# Patient Record
Sex: Female | Born: 1990 | Race: White | Hispanic: No | Marital: Married | Smoking: Never smoker
Health system: Southern US, Community
[De-identification: ages and names within clinical notes are randomized; demographics above are authoritative.]

## PROBLEM LIST (undated history)

## (undated) DIAGNOSIS — I10 Essential (primary) hypertension: Secondary | ICD-10-CM

## (undated) DIAGNOSIS — G43909 Migraine, unspecified, not intractable, without status migrainosus: Secondary | ICD-10-CM

## (undated) DIAGNOSIS — Z8489 Family history of other specified conditions: Secondary | ICD-10-CM

## (undated) DIAGNOSIS — T8859XA Other complications of anesthesia, initial encounter: Secondary | ICD-10-CM

## (undated) DIAGNOSIS — F419 Anxiety disorder, unspecified: Secondary | ICD-10-CM

## (undated) DIAGNOSIS — T4145XA Adverse effect of unspecified anesthetic, initial encounter: Secondary | ICD-10-CM

## (undated) HISTORY — PX: ADENOIDECTOMY: SUR15

## (undated) HISTORY — PX: TONSILLECTOMY: SUR1361

---
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
## 2008-10-06 ENCOUNTER — Inpatient Hospital Stay (HOSPITAL_COMMUNITY): Admission: AD | Admit: 2008-10-06 | Discharge: 2008-10-06 | Payer: Self-pay | Admitting: Obstetrics & Gynecology

## 2008-11-07 ENCOUNTER — Inpatient Hospital Stay (HOSPITAL_COMMUNITY): Admission: AD | Admit: 2008-11-07 | Discharge: 2008-11-08 | Payer: Self-pay | Admitting: Obstetrics & Gynecology

## 2008-11-27 ENCOUNTER — Inpatient Hospital Stay (HOSPITAL_COMMUNITY): Admission: AD | Admit: 2008-11-27 | Discharge: 2008-11-27 | Payer: Self-pay | Admitting: Obstetrics & Gynecology

## 2008-12-21 ENCOUNTER — Inpatient Hospital Stay (HOSPITAL_COMMUNITY): Admission: AD | Admit: 2008-12-21 | Discharge: 2008-12-22 | Payer: Self-pay | Admitting: Obstetrics & Gynecology

## 2008-12-26 ENCOUNTER — Inpatient Hospital Stay (HOSPITAL_COMMUNITY): Admission: AD | Admit: 2008-12-26 | Discharge: 2008-12-26 | Payer: Self-pay | Admitting: Obstetrics & Gynecology

## 2008-12-30 ENCOUNTER — Inpatient Hospital Stay (HOSPITAL_COMMUNITY): Admission: AD | Admit: 2008-12-30 | Discharge: 2008-12-31 | Payer: Self-pay | Admitting: Obstetrics & Gynecology

## 2009-01-02 ENCOUNTER — Inpatient Hospital Stay (HOSPITAL_COMMUNITY): Admission: AD | Admit: 2009-01-02 | Discharge: 2009-01-04 | Payer: Self-pay | Admitting: Obstetrics & Gynecology

## 2009-02-04 ENCOUNTER — Emergency Department (HOSPITAL_COMMUNITY): Admission: EM | Admit: 2009-02-04 | Discharge: 2009-02-04 | Payer: Self-pay | Admitting: Emergency Medicine

## 2010-04-01 IMAGING — CT CT HEAD W/O CM
1 series · 16 of 30 positions shown, 20 images · non-contrast
Comparison: None

CLINICAL DATA: Migraine headache and dizziness; 5 weeks postpartum.

CT HEAD WITHOUT CONTRAST
TECHNIQUE: Contiguous axial images were obtained from the base of
the skull through the vertex without contrast.

[Series 2: headseq 4.8 h37s · axial · 0.46mm/px · z∈[+115,+248]mm · 16 of 30 slices shown, 20 images]
[im 2/30  brain]
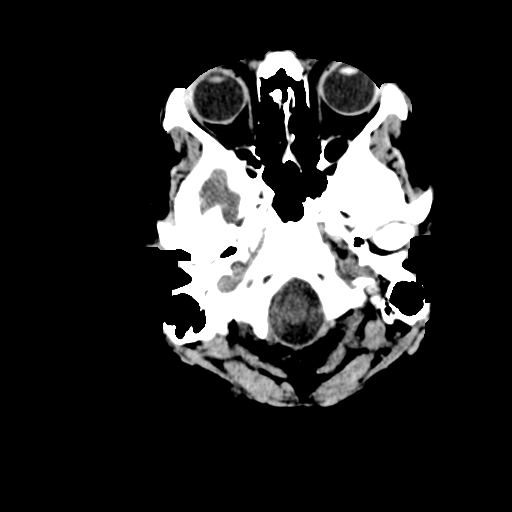
[im 2/30  bone]
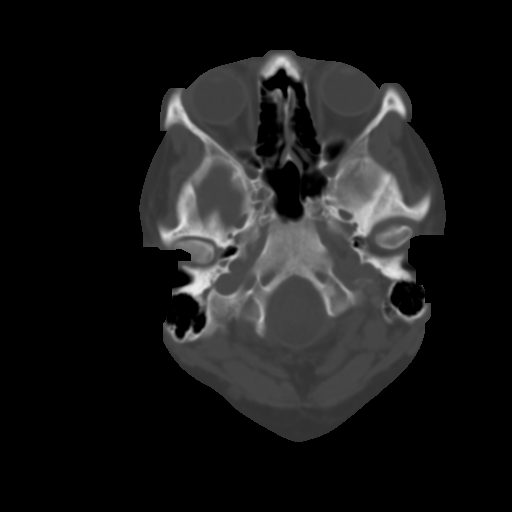
[im 4/30  brain]
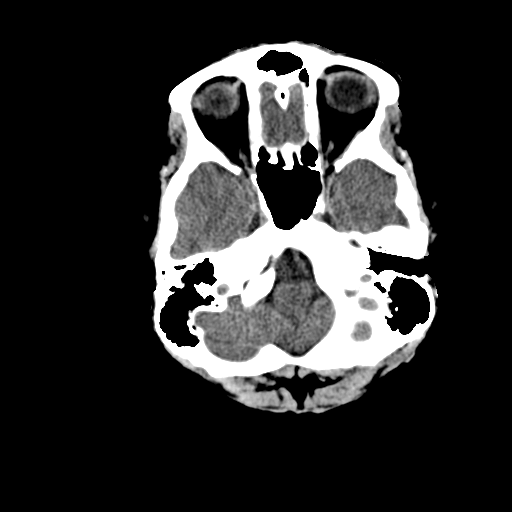
[im 6/30  brain]
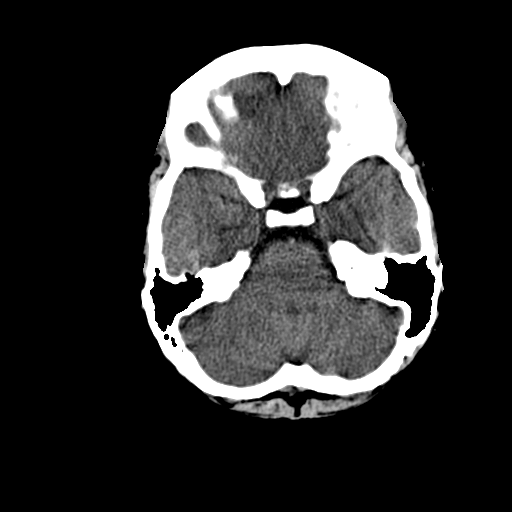
[im 8/30  brain]
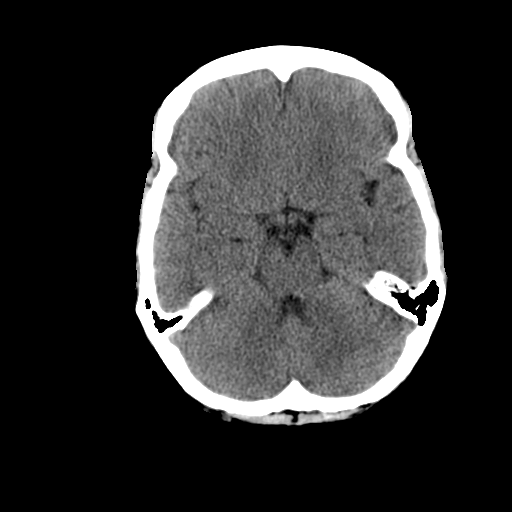
[im 9/30  brain]
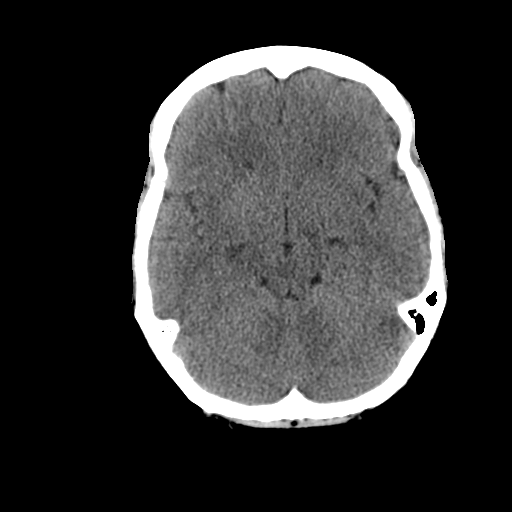
[im 9/30  bone]
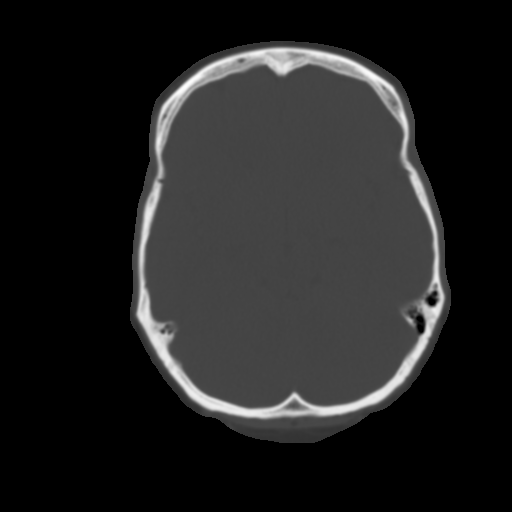
[im 11/30  brain]
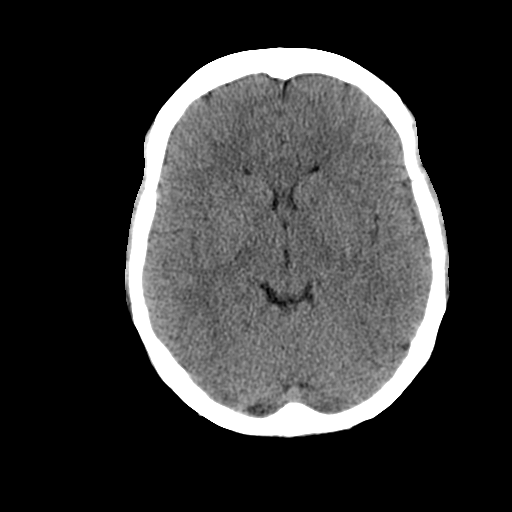
[im 13/30  brain]
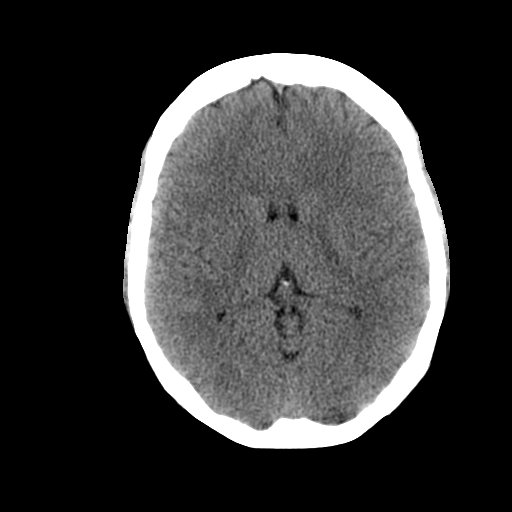
[im 15/30  brain]
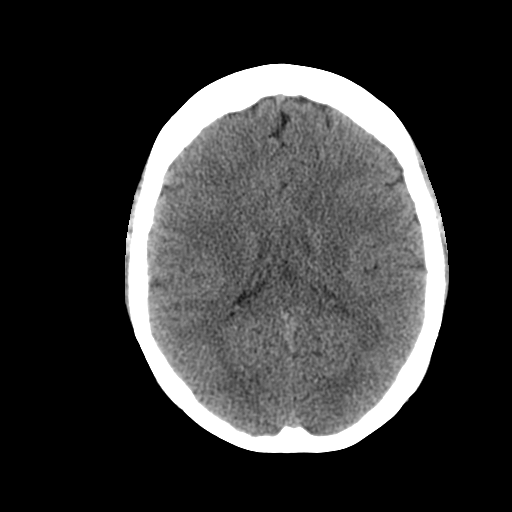
[im 16/30  brain]
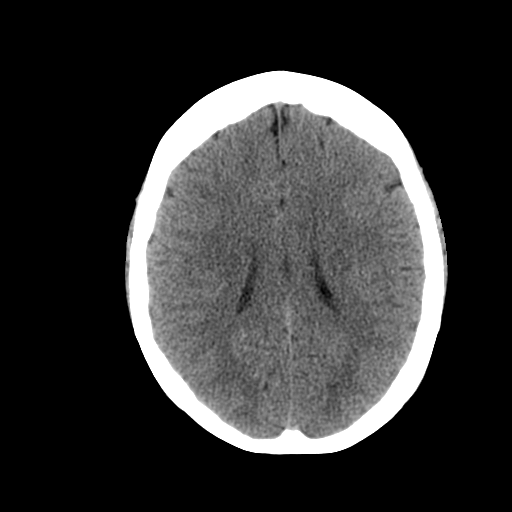
[im 16/30  bone]
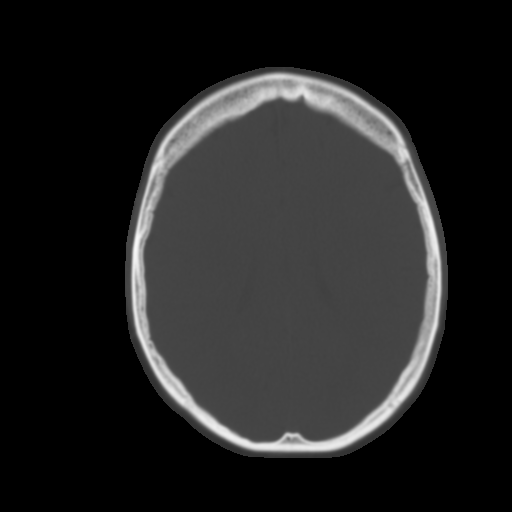
[im 18/30  brain]
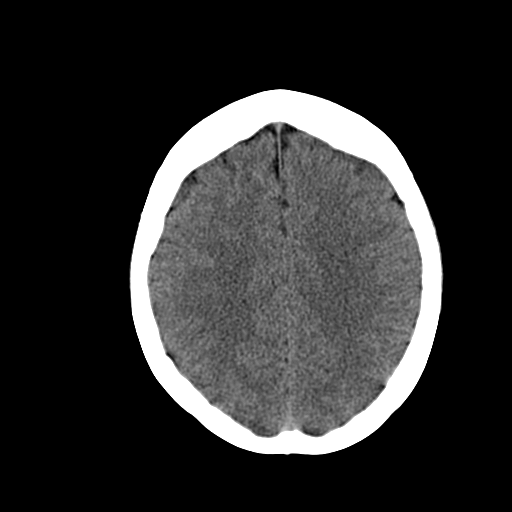
[im 20/30  brain]
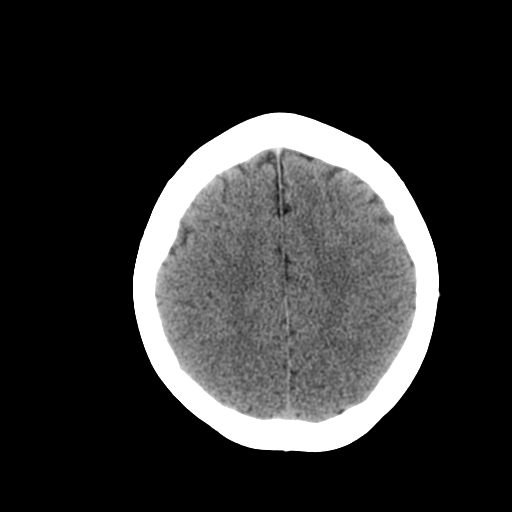
[im 22/30  brain]
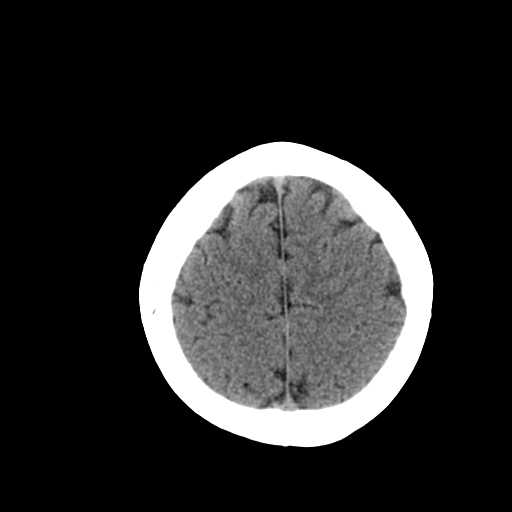
[im 23/30  brain]
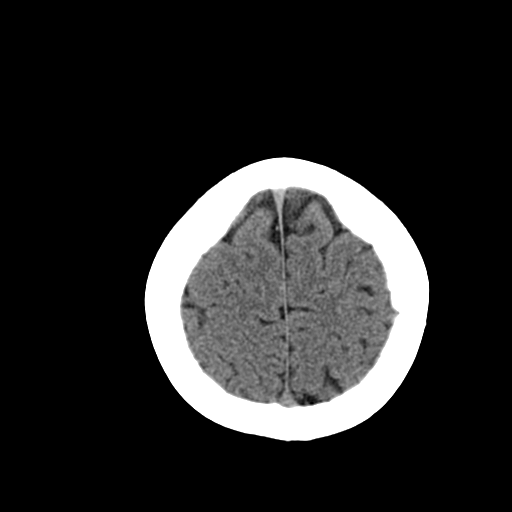
[im 23/30  bone]
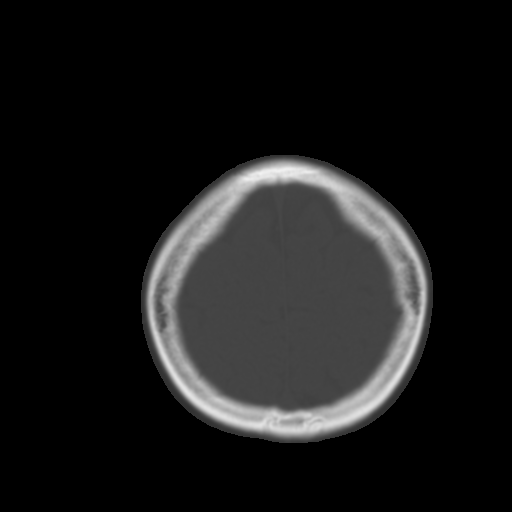
[im 25/30  brain]
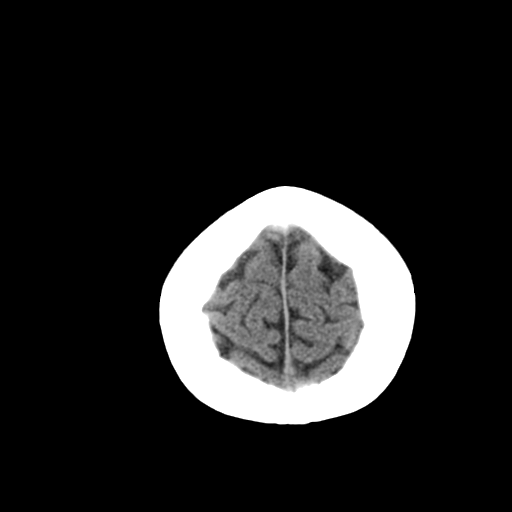
[im 27/30  brain]
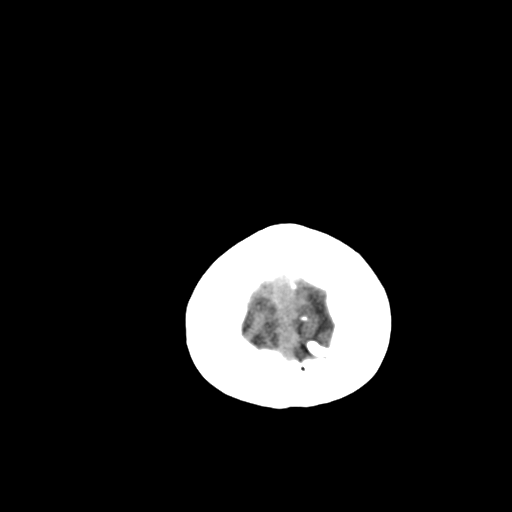
[im 29/30  brain]
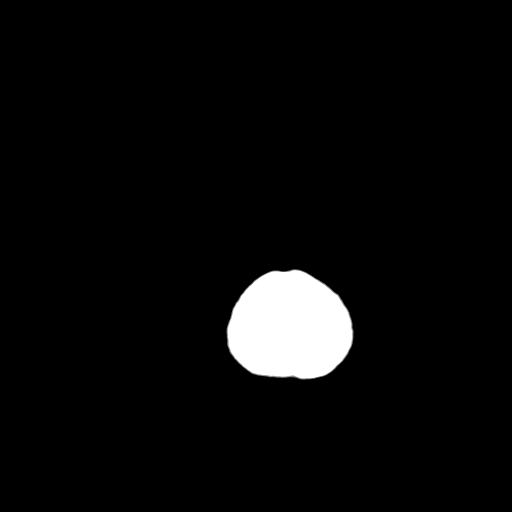

[16 of 30 positions shown; findings below may reference images not displayed]

FINDINGS: There is no evidence of acute infarction, mass lesion, or
intra- or extra-axial hemorrhage on CT.

The posterior fossa, including the cerebellum, brainstem and fourth
ventricle, is within normal limits.  The third and lateral
ventricles, and basal ganglia are unremarkable in appearance.  The
cerebral hemispheres are symmetric in appearance, with normal gray-
white differentiation.  No mass effect or midline shift is seen.

There is no evidence of fracture; visualized osseous structures are
unremarkable in appearance.  The visualized portions of the orbits
are within normal limits.  The paranasal sinuses and mastoid air
cells are well-aerated.  No significant soft tissue abnormalities
are seen.
IMPRESSION: No acute intracranial pathology seen.

## 2010-05-01 ENCOUNTER — Inpatient Hospital Stay (HOSPITAL_COMMUNITY)
Admission: AD | Admit: 2010-05-01 | Discharge: 2010-05-02 | Disposition: A | Discharge status: Home / Self Care | Payer: Medicaid Other | Source: Ambulatory Visit | Attending: Obstetrics & Gynecology | Admitting: Obstetrics & Gynecology

## 2010-05-01 DIAGNOSIS — O9989 Other specified diseases and conditions complicating pregnancy, childbirth and the puerperium: Secondary | ICD-10-CM

## 2010-05-01 DIAGNOSIS — R42 Dizziness and giddiness: Secondary | ICD-10-CM | POA: Insufficient documentation

## 2010-05-01 DIAGNOSIS — O99891 Other specified diseases and conditions complicating pregnancy: Secondary | ICD-10-CM | POA: Insufficient documentation

## 2010-05-01 LAB — URINALYSIS, ROUTINE W REFLEX MICROSCOPIC
Hgb urine dipstick: NEGATIVE
Ketones, ur: NEGATIVE mg/dL
Protein, ur: NEGATIVE mg/dL
Urine Glucose, Fasting: NEGATIVE mg/dL
Urobilinogen, UA: 0.2 mg/dL (ref 0.0–1.0)

## 2010-05-01 LAB — URINE MICROSCOPIC-ADD ON

## 2010-05-11 ENCOUNTER — Inpatient Hospital Stay (HOSPITAL_COMMUNITY)
Admission: AD | Admit: 2010-05-11 | Discharge: 2010-05-14 | DRG: 775 | Disposition: A | Discharge status: Home / Self Care | Payer: Medicaid Other | Source: Ambulatory Visit | Attending: Obstetrics & Gynecology | Admitting: Obstetrics & Gynecology

## 2010-05-11 LAB — CBC
HCT: 34.3 % — ABNORMAL LOW (ref 36.0–46.0)
Hemoglobin: 11 g/dL — ABNORMAL LOW (ref 12.0–15.0)
MCV: 79.6 fL (ref 78.0–100.0)
WBC: 13.2 10*3/uL — ABNORMAL HIGH (ref 4.0–10.5)

## 2010-05-13 LAB — CBC
HCT: 26.2 % — ABNORMAL LOW (ref 36.0–46.0)
Hemoglobin: 8.4 g/dL — ABNORMAL LOW (ref 12.0–15.0)
MCV: 80.9 fL (ref 78.0–100.0)
RBC: 3.24 MIL/uL — ABNORMAL LOW (ref 3.87–5.11)
WBC: 10.7 10*3/uL — ABNORMAL HIGH (ref 4.0–10.5)

## 2010-05-14 LAB — RH IMMUNE GLOB WKUP(>/=20WKS)(NOT WOMEN'S HOSP): Unit division: 0

## 2010-06-08 LAB — DIFFERENTIAL
Basophils Absolute: 0 10*3/uL (ref 0.0–0.1)
Eosinophils Relative: 2 % (ref 0–5)
Lymphocytes Relative: 36 % (ref 12–46)

## 2010-06-08 LAB — CBC
HCT: 39.3 % (ref 36.0–46.0)
Platelets: 257 10*3/uL (ref 150–400)
RDW: 12.2 % (ref 11.5–15.5)

## 2010-06-08 LAB — BASIC METABOLIC PANEL
BUN: 17 mg/dL (ref 6–23)
Calcium: 9.5 mg/dL (ref 8.4–10.5)
Creatinine, Ser: 1.04 mg/dL (ref 0.4–1.2)
GFR calc non Af Amer: >60 mL/min (ref >60)
Glucose, Bld: 79 mg/dL (ref 70–99)
Potassium: 3.7 mEq/L (ref 3.5–5.1)

## 2010-06-10 LAB — URINALYSIS, ROUTINE W REFLEX MICROSCOPIC
Ketones, ur: 15 mg/dL — AB
Nitrite: NEGATIVE
Protein, ur: NEGATIVE mg/dL
pH: 6.5 (ref 5.0–8.0)

## 2010-06-10 LAB — CBC
HCT: 36.5 % (ref 36.0–46.0)
Hemoglobin: 12.5 g/dL (ref 12.0–15.0)
RBC: 3.99 MIL/uL (ref 3.87–5.11)
WBC: 13.1 10*3/uL — ABNORMAL HIGH (ref 4.0–10.5)
WBC: 17.1 10*3/uL — ABNORMAL HIGH (ref 4.0–10.5)

## 2010-06-10 LAB — URINE CULTURE: Colony Count: 25000

## 2010-06-10 LAB — RPR: RPR Ser Ql: NONREACTIVE

## 2010-06-10 LAB — URINE MICROSCOPIC-ADD ON

## 2010-06-11 LAB — URINE MICROSCOPIC-ADD ON

## 2010-06-11 LAB — URINALYSIS, ROUTINE W REFLEX MICROSCOPIC
Glucose, UA: NEGATIVE mg/dL
Glucose, UA: 100 mg/dL — AB
Hgb urine dipstick: NEGATIVE
Specific Gravity, Urine: 1.015 (ref 1.005–1.030)
Specific Gravity, Urine: >1.03 — ABNORMAL HIGH (ref 1.005–1.030)
Urobilinogen, UA: 1 mg/dL (ref 0.0–1.0)
Urobilinogen, UA: 2 mg/dL — ABNORMAL HIGH (ref 0.0–1.0)

## 2010-06-11 LAB — URINE CULTURE

## 2010-06-11 LAB — WET PREP, GENITAL
Clue Cells Wet Prep HPF POC: NONE SEEN
Trich, Wet Prep: NONE SEEN
Yeast Wet Prep HPF POC: NONE SEEN

## 2010-06-11 LAB — GC/CHLAMYDIA PROBE AMP, GENITAL: GC Probe Amp, Genital: NEGATIVE

## 2010-06-12 LAB — WET PREP, GENITAL
Trich, Wet Prep: NONE SEEN
Yeast Wet Prep HPF POC: NONE SEEN

## 2010-06-12 LAB — URINALYSIS, ROUTINE W REFLEX MICROSCOPIC
Bilirubin Urine: NEGATIVE
Nitrite: NEGATIVE
Specific Gravity, Urine: 1.025 (ref 1.005–1.030)
Urobilinogen, UA: 1 mg/dL (ref 0.0–1.0)

## 2010-07-20 NOTE — Consult Note (Signed)
NAME:  [name], [name] NO.:  [id number]   MEDICAL RECORD NO.:  [id number]          PATIENT TYPE:  MAT   LOCATION:  [facility]                          FACILITY:  [facility]   PHYSICIAN:  Allie Bossier, MD        DATE OF BIRTH:  [birth year]   DATE OF CONSULTATION:  DATE OF DISCHARGE:                                 CONSULTATION   Jennifer Briggs is a 20 year old gravida 1, para 0 who is about [redacted] weeks  pregnant.  She is a patient of [facility] in [place].  She  came in to be evaluated after talking with the nursing service following  a fall at her house.  She slipped on one stair, and actually just sort  of twisted her foot.  She did not land on her abdomen or have any blow  to her abdomen at all.  Her foot actually fine; may be a little sore.  It is not swollen or injured in any way.  We have gotten Dopplers on the  baby, and it really is impossible to do a tracing on her.  We will keep  a TOCO on her for about an hour.  She is not having any vaginal  bleeding, any abdominal tenderness.  Basically, was she was just kind of  scared and wanted to make sure the baby was okay.  At this point it  looks like it is.  So if everything stays fine, we will discharge her  home.      Jacklyn Shell, C.N.M.      Allie Bossier, MD  Electronically Signed    FC/MEDQ  D:  10/04/2006  T:  10/04/2006  Job:  [id number]   cc:   [facility]

## 2010-11-01 ENCOUNTER — Other Ambulatory Visit (HOSPITAL_COMMUNITY): Payer: Self-pay | Admitting: Family Medicine

## 2010-11-01 DIAGNOSIS — R1032 Left lower quadrant pain: Secondary | ICD-10-CM

## 2010-11-02 ENCOUNTER — Ambulatory Visit (HOSPITAL_COMMUNITY)
Admission: RE | Admit: 2010-11-02 | Discharge: 2010-11-02 | Disposition: A | Discharge status: Home / Self Care | Payer: Medicaid Other | Source: Ambulatory Visit | Attending: Family Medicine | Admitting: Family Medicine

## 2010-11-02 ENCOUNTER — Other Ambulatory Visit (HOSPITAL_COMMUNITY): Payer: Self-pay | Admitting: Family Medicine

## 2010-11-02 DIAGNOSIS — R1032 Left lower quadrant pain: Secondary | ICD-10-CM | POA: Insufficient documentation

## 2010-12-14 LAB — BASIC METABOLIC PANEL
CO2: 26
Calcium: 9
Glucose, Bld: 83
Sodium: 136

## 2010-12-14 LAB — DIFFERENTIAL
Basophils Absolute: 0
Basophils Relative: 0
Eosinophils Relative: 1
Monocytes Absolute: 0.6
Neutro Abs: 6.8

## 2010-12-14 LAB — CBC
Hemoglobin: 12.6
MCHC: 33.1
RDW: 12.8

## 2010-12-15 LAB — CBC
HCT: 34 — ABNORMAL LOW
Hemoglobin: 11.7 — ABNORMAL LOW
MCHC: 34.4
MCV: 84.5
RBC: 4.03

## 2010-12-15 LAB — WET PREP, GENITAL: Trich, Wet Prep: NONE SEEN

## 2010-12-20 LAB — URINALYSIS, ROUTINE W REFLEX MICROSCOPIC
Bilirubin Urine: NEGATIVE
Glucose, UA: NEGATIVE
Hgb urine dipstick: NEGATIVE
Ketones, ur: NEGATIVE
pH: 6.5

## 2010-12-20 LAB — URINE MICROSCOPIC-ADD ON: RBC / HPF: NONE SEEN

## 2011-01-06 ENCOUNTER — Emergency Department (HOSPITAL_COMMUNITY)
Admission: EM | Admit: 2011-01-06 | Discharge: 2011-01-06 | Disposition: A | Discharge status: Home / Self Care | Payer: Self-pay | Attending: Emergency Medicine | Admitting: Emergency Medicine

## 2011-01-06 ENCOUNTER — Encounter: Payer: Self-pay | Admitting: *Deleted

## 2011-01-06 DIAGNOSIS — W268XXA Contact with other sharp object(s), not elsewhere classified, initial encounter: Secondary | ICD-10-CM | POA: Insufficient documentation

## 2011-01-06 DIAGNOSIS — S61209A Unspecified open wound of unspecified finger without damage to nail, initial encounter: Secondary | ICD-10-CM | POA: Insufficient documentation

## 2011-01-06 DIAGNOSIS — S61219A Laceration without foreign body of unspecified finger without damage to nail, initial encounter: Secondary | ICD-10-CM

## 2011-01-06 NOTE — ED Notes (Signed)
Pt a/ox4. Resp even and unlabored. NAD at this time. D/C instructions reviewed with pt. Pt verbalized understanding. Pt ambulated to POV. 

## 2011-01-06 NOTE — ED Provider Notes (Signed)
History     CSN: [id number] Arrival date & time: 01/06/2011  8:12 PM   First MD Initiated Contact with Patient 01/06/11 2023      Chief Complaint  Patient presents with  . Laceration    (Consider location/radiation/quality/duration/timing/severity/associated sxs/prior treatment) HPI Comments: Pt was coring an apple and cut the tip of her L 2nd finger.  Patient is a 19 y.o. female presenting with skin laceration. The history is provided by the patient. No language interpreter was used.  Laceration  The incident occurred less than 1 hour ago. The laceration is located on the left hand. The laceration is 1 cm in size. Injury mechanism: an apple corer. The pain is at a severity of 4/10. The pain has been constant since onset. She reports no foreign bodies present. Her tetanus status is unknown.    History reviewed. No pertinent past medical history.  Past Surgical History  Procedure Date  . Tonsillectomy     No family history on file.  History  Substance Use Topics  . Smoking status: Never Smoker   . Smokeless tobacco: Not on file  . Alcohol Use: No    OB History    Grav Para Term Preterm Abortions TAB SAB Ect Mult Living                  Review of Systems  Skin: Positive for wound.  All other systems reviewed and are negative.    Allergies  Review of patient's allergies indicates no known allergies.  Home Medications   Current Outpatient Rx  Name Route Sig Dispense Refill  . ACETAMINOPHEN 500 MG PO TABS Oral Take 1,000 mg by mouth daily as needed. For headache pain     . VITAMIN B-12 SL Sublingual Place 3 tablets under the tongue daily.      . IBUPROFEN 200 MG PO TABS Oral Take 800 mg by mouth as needed. For pain     . VALERIAN 250 MG PO CAPS Oral Take 1 capsule by mouth 3 (three) times daily. For anxiety       BP 113/48  Pulse 90  Temp(Src) 98.2 F (36.8 C) (Oral)  Resp 20  Ht 5\' 6"  (1.676 m)  Wt 217 lb (98.431 kg)  BMI 35.02 kg/m2  SpO2 100%  LMP  01/05/2011  Physical Exam  Nursing note and vitals reviewed. Constitutional: She is oriented to person, place, and time. She appears well-developed and well-nourished. No distress.  HENT:  Head: Normocephalic and atraumatic.  Eyes: EOM are normal.  Neck: Normal range of motion.  Cardiovascular: Normal rate, regular rhythm and normal heart sounds.   Pulmonary/Chest: Effort normal and breath sounds normal.  Abdominal: Soft. She exhibits no distension. There is no tenderness.  Musculoskeletal: Normal range of motion. She exhibits tenderness.       Right hand: She exhibits tenderness and laceration. She exhibits no bony tenderness, normal capillary refill, no deformity and no swelling. normal sensation noted. Normal strength noted.       Hands: Neurological: She is alert and oriented to person, place, and time.  Skin: Skin is warm and dry.  Psychiatric: She has a normal mood and affect. Judgment normal.    ED Course  LACERATION REPAIR Date/Time: 01/06/2011 9:14 PM Performed by: Worthy Rancher Authorized by: Worthy Rancher Consent: Verbal consent obtained. Written consent not obtained. Risks and benefits: risks, benefits and alternatives were discussed Consent given by: patient Patient understanding: patient states understanding of the procedure being performed Patient  consent: the patient's understanding of the procedure matches consent given Patient identity confirmed: verbally with patient Time out: Immediately prior to procedure a "time out" was called to verify the correct patient, procedure, equipment, support staff and site/side marked as required. Body area: upper extremity Location details: left index finger Laceration length: 1 cm Foreign bodies: no foreign bodies Tendon involvement: none Nerve involvement: none Vascular damage: no Anesthetic total: 0 ml Irrigation solution: tap water Debridement: none Degree of undermining: none Skin closure: glue Approximation:  close Approximation difficulty: simple Patient tolerance: Patient tolerated the procedure well with no immediate complications.   (including critical care time)  Labs Reviewed - No data to display No results found.   No diagnosis found.    MDM          Worthy Rancher, PA 01/06/11 2110  Worthy Rancher, PA 01/06/11 2121

## 2011-01-06 NOTE — ED Notes (Signed)
Pt reports she cut the tip of her rt index finger on a apple slicer, small cut noted to tip of finger

## 2011-01-07 NOTE — ED Provider Notes (Signed)
Medical screening examination/treatment/procedure(s) were performed by non-physician practitioner and as supervising physician I was immediately available for consultation/collaboration.   Nelia Shi, MD 01/07/11 [phone]

## 2011-03-08 HISTORY — PX: TOOTH EXTRACTION: SUR596

## 2011-05-28 ENCOUNTER — Encounter (HOSPITAL_COMMUNITY): Payer: Self-pay | Admitting: Emergency Medicine

## 2011-05-28 ENCOUNTER — Emergency Department (HOSPITAL_COMMUNITY)
Admission: EM | Admit: 2011-05-28 | Discharge: 2011-05-28 | Disposition: A | Discharge status: Home / Self Care | Payer: Self-pay | Attending: Emergency Medicine | Admitting: Emergency Medicine

## 2011-05-28 DIAGNOSIS — S025XXA Fracture of tooth (traumatic), initial encounter for closed fracture: Secondary | ICD-10-CM | POA: Insufficient documentation

## 2011-05-28 DIAGNOSIS — I10 Essential (primary) hypertension: Secondary | ICD-10-CM | POA: Insufficient documentation

## 2011-05-28 DIAGNOSIS — X58XXXA Exposure to other specified factors, initial encounter: Secondary | ICD-10-CM | POA: Insufficient documentation

## 2011-05-28 DIAGNOSIS — K0889 Other specified disorders of teeth and supporting structures: Secondary | ICD-10-CM

## 2011-05-28 HISTORY — DX: Essential (primary) hypertension: I10

## 2011-05-28 MED ORDER — HYDROCODONE-ACETAMINOPHEN 5-325 MG PO TABS: 1.0000 | ORAL_TABLET | ORAL | Status: AC | PRN

## 2011-05-28 MED ORDER — PENICILLIN V POTASSIUM 500 MG PO TABS: 500.0000 mg | ORAL_TABLET | Freq: Four times a day (QID) | ORAL | Status: AC

## 2011-05-28 MED ORDER — HYDROCODONE-ACETAMINOPHEN 5-325 MG PO TABS
1.0000 | ORAL_TABLET | Freq: Once | ORAL | Status: AC
  Administered 2011-05-28: 1 via ORAL
  Filled 2011-05-28: qty 1

## 2011-05-28 NOTE — ED Notes (Signed)
Pt with tooth on upper left that broke off.  Pt in pain for a week

## 2011-05-28 NOTE — ED Notes (Signed)
Pt presents to ED secondary to tooth pain sustained from a broken upper left molar. No visible swelling noted. Pt referred to a local dentist.

## 2011-05-28 NOTE — Discharge Instructions (Signed)
Dental Pain A tooth ache may be caused by cavities (tooth decay). Cavities expose the nerve of the tooth to air and hot or cold temperatures. It may come from an infection or abscess (also called a boil or furuncle) around your tooth. It is also often caused by dental caries (tooth decay). This causes the pain you are having. DIAGNOSIS  Your caregiver can diagnose this problem by exam. TREATMENT   If caused by an infection, it may be treated with medications which kill germs (antibiotics) and pain medications as prescribed by your caregiver. Take medications as directed.   Only take over-the-counter or prescription medicines for pain, discomfort, or fever as directed by your caregiver.   Whether the tooth ache today is caused by infection or dental disease, you should see your dentist as soon as possible for further care.  SEEK MEDICAL CARE IF: The exam and treatment you received today has been provided on an emergency basis only. This is not a substitute for complete medical or dental care. If your problem worsens or new problems (symptoms) appear, and you are unable to meet with your dentist, call or return to this location. SEEK IMMEDIATE MEDICAL CARE IF:   You have a fever.   You develop redness and swelling of your face, jaw, or neck.   You are unable to open your mouth.   You have severe pain uncontrolled by pain medicine.  MAKE SURE YOU:   Understand these instructions.   Will watch your condition.   Will get help right away if you are not doing well or get worse.  Document Released: 02/21/2005 Document Revised: 02/10/2011 Document Reviewed: 10/10/2007 [facility] Patient Information 2012 [place], [place].   You may take the hydrocodone prescribed if needed for pain, this medication will make you drowsy so do not drive within 4 hours of taking.  You do not have an obvious infection of your tooth at this time, however if you start to develop swelling around the tooth, then get the  penicillin prescription filled and start taking, otherwise just hold this prescription.  As discussed, call Dr. Oswaldo Done on Monday to get an appointment scheduled with him.  He should be willing to see you without cash up front, but you have to call Monday in order for this arrangement to be valid.

## 2011-05-30 NOTE — ED Provider Notes (Signed)
History     CSN: [id number]  Arrival date & time 05/28/11  2125   First MD Initiated Contact with Patient 05/28/11 2144      Chief Complaint  Patient presents with  . Dental Pain    (Consider location/radiation/quality/duration/timing/severity/associated sxs/prior treatment) Patient is a 21 y.o. female presenting with tooth pain. The history is provided by the patient.  Dental PainThe primary symptoms include mouth pain and dental injury. Primary symptoms do not include headaches, fever, shortness of breath or sore throat. Primary symptoms comment: She fractured a tooth about 1 week ago. The symptoms are worsening. The symptoms are new. The symptoms occur constantly.  Affected teeth include: 13/left upper second bicuspid. The injury is a fracture. Mechanism of dental injury: chewing.  Additional symptoms include: dental sensitivity to temperature. Additional symptoms do not include: gum swelling, gum tenderness, purulent gums, jaw pain, facial swelling and trouble swallowing.    Past Medical History  Diagnosis Date  . Hypertension     Past Surgical History  Procedure Date  . Tonsillectomy     No family history on file.  History  Substance Use Topics  . Smoking status: Never Smoker   . Smokeless tobacco: Not on file  . Alcohol Use: No    OB History    Grav Para Term Preterm Abortions TAB SAB Ect Mult Living                  Review of Systems  Constitutional: Negative for fever.  HENT: Positive for dental problem. Negative for congestion, sore throat, facial swelling, trouble swallowing and neck pain.   Eyes: Negative.   Respiratory: Negative for chest tightness and shortness of breath.   Cardiovascular: Negative for chest pain.  Gastrointestinal: Negative for nausea and abdominal pain.  Genitourinary: Negative.   Musculoskeletal: Negative for joint swelling and arthralgias.  Skin: Negative.  Negative for rash and wound.  Neurological: Negative for dizziness,  weakness, light-headedness, numbness and headaches.  Hematological: Negative.   Psychiatric/Behavioral: Negative.     Allergies  Review of patient's allergies indicates no known allergies.  Home Medications   Current Outpatient Rx  Name Route Sig Dispense Refill  . METOPROLOL TARTRATE 25 MG PO TABS Oral Take 25 mg by mouth 2 (two) times daily.    . ACETAMINOPHEN 500 MG PO TABS Oral Take 1,000 mg by mouth daily as needed. For headache pain     . VITAMIN B-12 SL Sublingual Place 3 tablets under the tongue daily.      Marland Kitchen HYDROCODONE-ACETAMINOPHEN 5-325 MG PO TABS Oral Take 1 tablet by mouth every 4 (four) hours as needed for pain. 15 tablet 0  . IBUPROFEN 200 MG PO TABS Oral Take 800 mg by mouth as needed. For pain     . PENICILLIN V POTASSIUM 500 MG PO TABS Oral Take 1 tablet (500 mg total) by mouth 4 (four) times daily. 40 tablet 0  . VALERIAN 250 MG PO CAPS Oral Take 1 capsule by mouth 3 (three) times daily. For anxiety       BP 118/63  Pulse 76  Temp(Src) 97.8 F (36.6 C) (Oral)  Resp 18  Ht 5\' 6"  (1.676 m)  Wt 223 lb (101.152 kg)  BMI 35.99 kg/m2  SpO2 100%  LMP 05/23/2011  Physical Exam  Constitutional: She is oriented to person, place, and time. She appears well-developed and well-nourished. No distress.  HENT:  Head: Normocephalic and atraumatic.  Right Ear: Tympanic membrane and external ear normal.  Left  Ear: Tympanic membrane and external ear normal.  Mouth/Throat: Oropharynx is clear and moist and mucous membranes are normal. No oral lesions. Dental abscesses present.    Eyes: Conjunctivae are normal.  Neck: Normal range of motion. Neck supple.  Cardiovascular: Normal rate and normal heart sounds.   Pulmonary/Chest: Effort normal.  Abdominal: She exhibits no distension.  Musculoskeletal: Normal range of motion.  Lymphadenopathy:    She has no cervical adenopathy.  Neurological: She is alert and oriented to person, place, and time.  Skin: Skin is warm and dry.  No erythema.  Psychiatric: She has a normal mood and affect.    ED Course  Procedures (including critical care time)  Labs Reviewed - No data to display No results found.   1. Pain, dental       MDM  Pain treated with hydrocodone.  Given prescription for pen -v,  But advised pt no sign of infection at this time - hold script - get filled she she develops worsening pain or swelling.  Dental referral given.        Candis Musa, PA 05/30/11 1327

## 2011-06-03 NOTE — ED Provider Notes (Signed)
Medical screening examination/treatment/procedure(s) were performed by non-physician practitioner and as supervising physician I was immediately available for consultation/collaboration.   Caroleen Stoermer W. Avi Archuleta, MD 06/03/11 0120 

## 2012-06-15 ENCOUNTER — Encounter (HOSPITAL_COMMUNITY): Payer: Self-pay | Admitting: Emergency Medicine

## 2012-06-15 ENCOUNTER — Emergency Department (HOSPITAL_COMMUNITY)
Admission: EM | Admit: 2012-06-15 | Discharge: 2012-06-15 | Disposition: A | Discharge status: Home / Self Care | Payer: No Typology Code available for payment source | Attending: Emergency Medicine | Admitting: Emergency Medicine

## 2012-06-15 DIAGNOSIS — Z8679 Personal history of other diseases of the circulatory system: Secondary | ICD-10-CM | POA: Insufficient documentation

## 2012-06-15 DIAGNOSIS — Y92410 Unspecified street and highway as the place of occurrence of the external cause: Secondary | ICD-10-CM | POA: Insufficient documentation

## 2012-06-15 DIAGNOSIS — Y9389 Activity, other specified: Secondary | ICD-10-CM | POA: Insufficient documentation

## 2012-06-15 DIAGNOSIS — Z79899 Other long term (current) drug therapy: Secondary | ICD-10-CM | POA: Insufficient documentation

## 2012-06-15 DIAGNOSIS — S161XXA Strain of muscle, fascia and tendon at neck level, initial encounter: Secondary | ICD-10-CM

## 2012-06-15 DIAGNOSIS — S139XXA Sprain of joints and ligaments of unspecified parts of neck, initial encounter: Secondary | ICD-10-CM | POA: Insufficient documentation

## 2012-06-15 MED ORDER — IBUPROFEN 400 MG PO TABS
600.0000 mg | ORAL_TABLET | Freq: Once | ORAL | Status: AC
  Administered 2012-06-15: 600 mg via ORAL
  Filled 2012-06-15: qty 2

## 2012-06-15 MED ORDER — HYDROCODONE-ACETAMINOPHEN 5-325 MG PO TABS: 1.0000 | ORAL_TABLET | ORAL | Status: DC | PRN

## 2012-06-15 MED ORDER — CYCLOBENZAPRINE HCL 10 MG PO TABS: 10.0000 mg | ORAL_TABLET | Freq: Two times a day (BID) | ORAL | Status: DC | PRN

## 2012-06-15 MED ORDER — IBUPROFEN 600 MG PO TABS: 600.0000 mg | ORAL_TABLET | Freq: Three times a day (TID) | ORAL | Status: DC | PRN

## 2012-06-15 MED ORDER — OXYCODONE-ACETAMINOPHEN 5-325 MG PO TABS
1.0000 | ORAL_TABLET | Freq: Once | ORAL | Status: AC
  Administered 2012-06-15: 1 via ORAL
  Filled 2012-06-15: qty 1
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
## 2012-08-21 ENCOUNTER — Encounter (HOSPITAL_COMMUNITY): Payer: Self-pay

## 2012-08-21 ENCOUNTER — Emergency Department (HOSPITAL_COMMUNITY): Payer: Self-pay

## 2012-08-21 ENCOUNTER — Emergency Department (HOSPITAL_COMMUNITY)
Admission: EM | Admit: 2012-08-21 | Discharge: 2012-08-21 | Disposition: A | Discharge status: Home / Self Care | Payer: Self-pay | Attending: Emergency Medicine | Admitting: Emergency Medicine

## 2012-08-21 DIAGNOSIS — R000 Tachycardia, unspecified: Secondary | ICD-10-CM | POA: Insufficient documentation

## 2012-08-21 DIAGNOSIS — K6289 Other specified diseases of anus and rectum: Secondary | ICD-10-CM | POA: Insufficient documentation

## 2012-08-21 DIAGNOSIS — G43909 Migraine, unspecified, not intractable, without status migrainosus: Secondary | ICD-10-CM | POA: Insufficient documentation

## 2012-08-21 DIAGNOSIS — K625 Hemorrhage of anus and rectum: Secondary | ICD-10-CM | POA: Insufficient documentation

## 2012-08-21 DIAGNOSIS — B349 Viral infection, unspecified: Secondary | ICD-10-CM

## 2012-08-21 DIAGNOSIS — Z791 Long term (current) use of non-steroidal anti-inflammatories (NSAID): Secondary | ICD-10-CM | POA: Insufficient documentation

## 2012-08-21 DIAGNOSIS — B9789 Other viral agents as the cause of diseases classified elsewhere: Secondary | ICD-10-CM | POA: Insufficient documentation

## 2012-08-21 DIAGNOSIS — R42 Dizziness and giddiness: Secondary | ICD-10-CM | POA: Insufficient documentation

## 2012-08-21 DIAGNOSIS — K921 Melena: Secondary | ICD-10-CM | POA: Insufficient documentation

## 2012-08-21 DIAGNOSIS — R509 Fever, unspecified: Secondary | ICD-10-CM | POA: Insufficient documentation

## 2012-08-21 DIAGNOSIS — R197 Diarrhea, unspecified: Secondary | ICD-10-CM | POA: Insufficient documentation

## 2012-08-21 DIAGNOSIS — R55 Syncope and collapse: Secondary | ICD-10-CM | POA: Insufficient documentation

## 2012-08-21 DIAGNOSIS — M545 Low back pain, unspecified: Secondary | ICD-10-CM | POA: Insufficient documentation

## 2012-08-21 DIAGNOSIS — R109 Unspecified abdominal pain: Secondary | ICD-10-CM | POA: Insufficient documentation

## 2012-08-21 DIAGNOSIS — R52 Pain, unspecified: Secondary | ICD-10-CM | POA: Insufficient documentation

## 2012-08-21 DIAGNOSIS — Z79899 Other long term (current) drug therapy: Secondary | ICD-10-CM | POA: Insufficient documentation

## 2012-08-21 DIAGNOSIS — I10 Essential (primary) hypertension: Secondary | ICD-10-CM | POA: Insufficient documentation

## 2012-08-21 DIAGNOSIS — R110 Nausea: Secondary | ICD-10-CM | POA: Insufficient documentation

## 2012-08-21 DIAGNOSIS — Z3202 Encounter for pregnancy test, result negative: Secondary | ICD-10-CM | POA: Insufficient documentation

## 2012-08-21 HISTORY — DX: Migraine, unspecified, not intractable, without status migrainosus: G43.909

## 2012-08-21 LAB — URINALYSIS, ROUTINE W REFLEX MICROSCOPIC
Bilirubin Urine: NEGATIVE
Ketones, ur: NEGATIVE mg/dL
Nitrite: NEGATIVE
Protein, ur: NEGATIVE mg/dL
Specific Gravity, Urine: 1.015 (ref 1.005–1.030)
Urobilinogen, UA: 0.2 mg/dL (ref 0.0–1.0)

## 2012-08-21 LAB — BASIC METABOLIC PANEL
CO2: 26 mEq/L (ref 19–32)
Chloride: 97 mEq/L (ref 96–112)
Creatinine, Ser: 0.95 mg/dL (ref 0.50–1.10)
Potassium: 3.3 mEq/L — ABNORMAL LOW (ref 3.5–5.1)

## 2012-08-21 LAB — CBC WITH DIFFERENTIAL/PLATELET
Basophils Absolute: 0 10*3/uL (ref 0.0–0.1)
HCT: 41.4 % (ref 36.0–46.0)
Hemoglobin: 14.5 g/dL (ref 12.0–15.0)
Lymphocytes Relative: 9 % — ABNORMAL LOW (ref 12–46)
Monocytes Absolute: 0.5 10*3/uL (ref 0.1–1.0)
Monocytes Relative: 5 % (ref 3–12)
Neutro Abs: 9.5 10*3/uL — ABNORMAL HIGH (ref 1.7–7.7)
Neutrophils Relative %: 86 % — ABNORMAL HIGH (ref 43–77)
WBC: 11 10*3/uL — ABNORMAL HIGH (ref 4.0–10.5)

## 2012-08-21 LAB — HEPATIC FUNCTION PANEL
Alkaline Phosphatase: 135 U/L — ABNORMAL HIGH (ref 39–117)
Indirect Bilirubin: 0.4 mg/dL (ref 0.3–0.9)
Total Protein: 7.7 g/dL (ref 6.0–8.3)

## 2012-08-21 LAB — LIPASE, BLOOD: Lipase: 19 U/L (ref 11–59)

## 2012-08-21 LAB — URINE MICROSCOPIC-ADD ON

## 2012-08-21 MED ORDER — SODIUM CHLORIDE 0.9 % IV SOLN
INTRAVENOUS | Status: DC
  Administered 2012-08-21: 16:00:00 via INTRAVENOUS

## 2012-08-21 MED ORDER — ONDANSETRON HCL 4 MG/2ML IJ SOLN
4.0000 mg | Freq: Once | INTRAMUSCULAR | Status: AC
  Administered 2012-08-21: 4 mg via INTRAVENOUS
  Filled 2012-08-21: qty 2

## 2012-08-21 MED ORDER — ACETAMINOPHEN 325 MG PO TABS
650.0000 mg | ORAL_TABLET | Freq: Once | ORAL | Status: AC
  Administered 2012-08-21: 650 mg via ORAL
  Filled 2012-08-21: qty 2

## 2012-08-21 MED ORDER — IOHEXOL 300 MG/ML  SOLN
100.0000 mL | Freq: Once | INTRAMUSCULAR | Status: AC | PRN
  Administered 2012-08-21: 100 mL via INTRAVENOUS

## 2012-08-21 MED ORDER — NAPROXEN 500 MG PO TABS: 500.0000 mg | ORAL_TABLET | Freq: Two times a day (BID) | ORAL | Status: DC

## 2012-08-21 MED ORDER — HYDROCODONE-ACETAMINOPHEN 5-325 MG PO TABS: 1.0000 | ORAL_TABLET | Freq: Four times a day (QID) | ORAL | Status: DC | PRN

## 2012-08-21 MED ORDER — IOHEXOL 300 MG/ML  SOLN
50.0000 mL | Freq: Once | INTRAMUSCULAR | Status: AC | PRN
  Administered 2012-08-21: 50 mL via ORAL

## 2012-08-21 MED ORDER — SODIUM CHLORIDE 0.9 % IV BOLUS (SEPSIS)
250.0000 mL | Freq: Once | INTRAVENOUS | Status: AC
  Administered 2012-08-21: 15:00:00 via INTRAVENOUS

## 2012-08-21 MED ORDER — HYDROMORPHONE HCL PF 1 MG/ML IJ SOLN
1.0000 mg | Freq: Once | INTRAMUSCULAR | Status: AC
  Administered 2012-08-21: 1 mg via INTRAVENOUS
  Filled 2012-08-21: qty 1

## 2012-08-21 NOTE — ED Provider Notes (Signed)
History     This chart was scribed for Shelda Jakes, MD, MD by Smitty Pluck, ED Scribe. The patient was seen in room APA17/APA17 and the patient's care was started at 2:03 PM.   CSN: [id number]  Arrival date & time 08/21/12  1322      Chief Complaint  Patient presents with  . Rectal Bleeding  . Fever     Patient is a 22 y.o. female presenting with hematochezia and fever. The history is provided by the patient and medical records. No language interpreter was used.  Rectal Bleeding Quality:  Bright red Amount:  Moderate Timing:  Constant Progression:  Worsening Chronicity:  Chronic Context: defecation   Similar prior episodes: yes   Relieved by:  Nothing Worsened by:  Defecation Ineffective treatments:  None tried Associated symptoms: abdominal pain, fever and light-headedness   Associated symptoms: no vomiting   Abdominal pain:    Location:  LLQ and RLQ   Quality:  Cramping   Severity:  Moderate   Timing:  Intermittent   Progression:  Worsening Fever:    Duration:  1 day   Timing:  Constant Fever Associated symptoms: chills, diarrhea and nausea   Associated symptoms: no chest pain, no confusion, no congestion, no cough, no dysuria, no rash, no rhinorrhea and no vomiting    HPI Comments: Jennifer Briggs is a 22 y.o. female who presents to the Emergency Department complaining of intermittent rectal bleeding that has been ongoing for past couple of years but bleeding increased within the last 2 days. She reports having fever onset 1 day ago (102.4 in ED). She states that she has lower abdominal cramping that is worse when she has urge for bowel movement onset 3 weeks ago. She mentions that she has lower back pain, HA, chills near syncope last night, chills and generalized body aches. She reports that she had 3-4 bowel movements with blood today. She reports that the toilet water is bright red after bowel movement and she is unable to see through the bloodShe reports  that she was [facility] for similar symptoms in 2013 and has had labs and external hemorrhoid exam without any findings. . Pt denies rectal pain, nausea, vomiting, diarrhea, weakness, cough, SOB and any other pain. LMP was 07-16-12. She reports that sometimes she has irregular menstrual cycles.     Pt goes to [facility] Dept treatment.   Past Medical History  Diagnosis Date  . Hypertension   . Migraines     Past Surgical History  Procedure Laterality Date  . Tonsillectomy    . Adenoidectomy      No family history on file.  History  Substance Use Topics  . Smoking status: Never Smoker   . Smokeless tobacco: Not on file  . Alcohol Use: No    OB History   Grav Para Term Preterm Abortions TAB SAB Ect Mult Living                  Review of Systems  Constitutional: Positive for fever and chills.  HENT: Negative for congestion and rhinorrhea.   Respiratory: Negative for cough and shortness of breath.   Cardiovascular: Negative for chest pain.  Gastrointestinal: Positive for nausea, abdominal pain, diarrhea, hematochezia and rectal pain. Negative for vomiting.  Genitourinary: Negative for dysuria and hematuria.  Musculoskeletal: Positive for back pain.  Skin: Negative for rash.  Neurological: Positive for light-headedness.  Psychiatric/Behavioral: Negative for confusion.  All other systems reviewed and are negative.  Allergies  Review of patient's allergies indicates no known allergies.  Home Medications   Current Outpatient Rx  Name  Route  Sig  Dispense  Refill  . Cyanocobalamin (VITAMIN B-12 SL)   Sublingual   Place 3 tablets under the tongue daily.           . cyclobenzaprine (FLEXERIL) 5 MG tablet   Oral   Take 5 mg by mouth daily as needed (for migraine pain).         Marland Kitchen ibuprofen (ADVIL,MOTRIN) 200 MG tablet   Oral   Take 400-800 mg by mouth as needed for pain (migraine pain). For pain         . vitamin E 100 UNIT capsule   Oral   Take  100 Units by mouth daily.         Marland Kitchen HYDROcodone-acetaminophen (NORCO/VICODIN) 5-325 MG per tablet   Oral   Take 1-2 tablets by mouth every 6 (six) hours as needed for pain.   15 tablet   0   . naproxen (NAPROSYN) 500 MG tablet   Oral   Take 1 tablet (500 mg total) by mouth 2 (two) times daily.   14 tablet   0     BP 134/74  Pulse 87  Temp(Src) 102.4 F (39.1 C) (Oral)  Resp 20  Ht 5\' 6"  (1.676 m)  Wt 223 lb (101.152 kg)  BMI 36.01 kg/m2  SpO2 100%  LMP 07/17/2012  Physical Exam  Nursing note and vitals reviewed. Constitutional: She is oriented to person, place, and time. She appears well-developed and well-nourished. No distress.  HENT:  Head: Normocephalic and atraumatic.  Eyes: Conjunctivae and EOM are normal. Pupils are equal, round, and reactive to light.  Neck: Normal range of motion. Neck supple. No tracheal deviation present.  Cardiovascular: Regular rhythm and normal heart sounds.  Tachycardia present.   No murmur heard. HR during exam is  118   Pulmonary/Chest: Effort normal and breath sounds normal. No respiratory distress. She has no wheezes. She has no rales.  Abdominal: Soft. Bowel sounds are normal. She exhibits no distension. There is no tenderness. There is no rebound and no guarding.  Genitourinary: Guaiac positive stool.  Perianal and anal exam without any significant abnormalities no external hemorrhoids. No anal fissure. No palpable rectal mass. Only trace amount of blood on exam. No gross blood no clots.  Musculoskeletal: Normal range of motion.  Neurological: She is alert and oriented to person, place, and time. No cranial nerve deficit. Coordination normal.  Skin: Skin is warm and dry.  Skin is hot to touch  Psychiatric: She has a normal mood and affect. Her behavior is normal.    ED Course  Procedures (including critical care time) DIAGNOSTIC STUDIES: Oxygen Saturation is 100% on room air, normal by my interpretation.   HR is tachycardic   COORDINATION OF CARE: 2:11 PM Discussed ED treatment with pt and pt agrees.   Results for orders placed during the hospital encounter of 08/21/12  CBC WITH DIFFERENTIAL      Result Value Range   WBC 11.0 (*) 4.0 - 10.5 K/uL   RBC 4.90  3.87 - 5.11 MIL/uL   Hemoglobin 14.5  12.0 - 15.0 g/dL   HCT 16.1  09.6 - 04.5 %   MCV 84.5  78.0 - 100.0 fL   MCH 29.6  26.0 - 34.0 pg   MCHC 35.0  30.0 - 36.0 g/dL   RDW 40.9  81.1 - 91.4 %  Platelets 212  150 - 400 K/uL   Neutrophils Relative % 86 (*) 43 - 77 %   Neutro Abs 9.5 (*) 1.7 - 7.7 K/uL   Lymphocytes Relative 9 (*) 12 - 46 %   Lymphs Abs 0.9  0.7 - 4.0 K/uL   Monocytes Relative 5  3 - 12 %   Monocytes Absolute 0.5  0.1 - 1.0 K/uL   Eosinophils Relative 0  0 - 5 %   Eosinophils Absolute 0.0  0.0 - 0.7 K/uL   Basophils Relative 0  0 - 1 %   Basophils Absolute 0.0  0.0 - 0.1 K/uL  BASIC METABOLIC PANEL      Result Value Range   Sodium 134 (*) 135 - 145 mEq/L   Potassium 3.3 (*) 3.5 - 5.1 mEq/L   Chloride 97  96 - 112 mEq/L   CO2 26  19 - 32 mEq/L   Glucose, Bld 101 (*) 70 - 99 mg/dL   BUN 11  6 - 23 mg/dL   Creatinine, Ser 1.61  0.50 - 1.10 mg/dL   Calcium 9.7  8.4 - 09.6 mg/dL   GFR calc non Af Amer 84 (*) >90 mL/min   GFR calc Af Amer >90  >90 mL/min  HEPATIC FUNCTION PANEL      Result Value Range   Total Protein 7.7  6.0 - 8.3 g/dL   Albumin 4.3  3.5 - 5.2 g/dL   AST 35  0 - 37 U/L   ALT 48 (*) 0 - 35 U/L   Alkaline Phosphatase 135 (*) 39 - 117 U/L   Total Bilirubin 0.6  0.3 - 1.2 mg/dL   Bilirubin, Direct 0.2  0.0 - 0.3 mg/dL   Indirect Bilirubin 0.4  0.3 - 0.9 mg/dL  LIPASE, BLOOD      Result Value Range   Lipase 19  11 - 59 U/L  URINALYSIS, ROUTINE W REFLEX MICROSCOPIC      Result Value Range   Color, Urine YELLOW  YELLOW   APPearance CLEAR  CLEAR   Specific Gravity, Urine 1.015  1.005 - 1.030   pH 5.5  5.0 - 8.0   Glucose, UA NEGATIVE  NEGATIVE mg/dL   Hgb urine dipstick NEGATIVE  NEGATIVE   Bilirubin Urine  NEGATIVE  NEGATIVE   Ketones, ur NEGATIVE  NEGATIVE mg/dL   Protein, ur NEGATIVE  NEGATIVE mg/dL   Urobilinogen, UA 0.2  0.0 - 1.0 mg/dL   Nitrite NEGATIVE  NEGATIVE   Leukocytes, UA TRACE (*) NEGATIVE  PREGNANCY, URINE      Result Value Range   Preg Test, Ur NEGATIVE  NEGATIVE  URINE MICROSCOPIC-ADD ON      Result Value Range   Squamous Epithelial / LPF FEW (*) RARE   WBC, UA 0-2  <3 WBC/hpf   Bacteria, UA RARE  RARE      Ct Abdomen Pelvis W Contrast  08/21/2012   *RADIOLOGY REPORT*  Clinical Data: Rectal bleeding and fever  CT ABDOMEN AND PELVIS WITH CONTRAST  Technique:  Multidetector CT imaging of the abdomen and pelvis was performed following the standard protocol during bolus administration of intravenous contrast.  Contrast: 50mL OMNIPAQUE IOHEXOL 300 MG/ML  SOLN, OMNIPAQUE IOHEXOL 300 MG/ML  SOLN  Comparison: Ultrasound 11/02/2010  Findings: No pleural or pericardial effusion.  Lung bases are clear.  Mild diffuse low attenuation within the liver parenchyma noted consistent with hepatic steatosis.  No suspicious focal liver abnormality.  Gallstones identified.  There is no gallbladder  wall thickening, pericholecystic fluid or biliary dilatation.  The pancreas is within normal limits.  Normal appearance of the spleen.  The adrenal glands are normal.  The kidneys are both unremarkable. No evidence for obstructive uropathy.  The urinary bladder appears normal.  The uterus and the adnexal structures are both unremarkable.  Normal caliber of the abdominal aorta.  There is no aneurysm.  No enlarged upper abdominal lymph nodes.  There is no pelvic or inguinal adenopathy noted.  There is moderate distention of the stomach.  The small bowel loops appear normal.  Normal appearance of the appendix.  The colon is unremarkable.  Review of the visualized osseous structures is unremarkable.  No aggressive lytic or sclerotic bone lesions identified.  IMPRESSION:  1.  No acute findings. 2.  No  findings to explain rectal bleeding.   Original Report Authenticated By: Signa Kell, M.D.    Date: 08/21/2012  Rate: 125  Rhythm: normal sinus rhythm  QRS Axis: normal  Intervals: normal  ST/T Wave abnormalities: normal  Conduction Disutrbances:none  Narrative Interpretation:   Old EKG Reviewed: none available No old EKG for comparison. Today's EKG normal except for sinus tachycardia    1. Viral syndrome   2. Rectal bleeding       MDM  Workup for the rectal bleeding without significant findings. Rectal examination with only a trace amount of blood. CT scan negative. Labs normal no evidence of significant blood loss. Leave it for fever and other symptoms to include headache crampy abdominal pain may all be viral related and not directly related to a longer history of rectal bleeding. Followup with GI medicine will be important for the rectal bleeding colonoscopy will probably be required. CT scan without evidence of inflammatory bowel disease. No sniffing no electrolyte abnormalities mild leukocytosis of 11,000. Urinalysis negative for urinary tract infection. No evidence of pregnancy.      I personally performed the services described in this documentation, which was scribed in my presence. The recorded information has been reviewed and is accurate.     Shelda Jakes, MD 08/21/12 [phone]

## 2012-08-21 NOTE — ED Notes (Signed)
Pt reports has had bright red blood in stool off and on for the past couple of years.  Reports has been seen at the health dept several times but says never really gets any help for the rectal bleeding.  Reports fever since yesterday, generalized weakness, headache, and intermittent lower abd cramping.    Denies vomiting.

## 2012-08-21 NOTE — ED Notes (Signed)
POC Occult Blood result positive.

## 2013-04-01 ENCOUNTER — Encounter (HOSPITAL_COMMUNITY): Payer: Self-pay | Admitting: Emergency Medicine

## 2013-04-01 DIAGNOSIS — R5381 Other malaise: Secondary | ICD-10-CM | POA: Insufficient documentation

## 2013-04-01 DIAGNOSIS — Z79899 Other long term (current) drug therapy: Secondary | ICD-10-CM | POA: Insufficient documentation

## 2013-04-01 DIAGNOSIS — R0789 Other chest pain: Secondary | ICD-10-CM | POA: Insufficient documentation

## 2013-04-01 DIAGNOSIS — R5383 Other fatigue: Secondary | ICD-10-CM

## 2013-04-01 DIAGNOSIS — I517 Cardiomegaly: Secondary | ICD-10-CM | POA: Insufficient documentation

## 2013-04-01 DIAGNOSIS — I10 Essential (primary) hypertension: Secondary | ICD-10-CM | POA: Insufficient documentation

## 2013-04-01 DIAGNOSIS — G43909 Migraine, unspecified, not intractable, without status migrainosus: Secondary | ICD-10-CM | POA: Insufficient documentation

## 2013-04-01 DIAGNOSIS — Z791 Long term (current) use of non-steroidal anti-inflammatories (NSAID): Secondary | ICD-10-CM | POA: Insufficient documentation

## 2013-04-01 DIAGNOSIS — R0602 Shortness of breath: Secondary | ICD-10-CM | POA: Insufficient documentation

## 2013-04-01 NOTE — ED Notes (Signed)
Pt c/o chest pain x one week and when she lays down she can't get her breath.

## 2013-04-02 ENCOUNTER — Emergency Department (HOSPITAL_COMMUNITY)
Admission: EM | Admit: 2013-04-02 | Discharge: 2013-04-02 | Disposition: A | Discharge status: Home / Self Care | Payer: Self-pay | Attending: Emergency Medicine | Admitting: Emergency Medicine

## 2013-04-02 ENCOUNTER — Emergency Department (HOSPITAL_COMMUNITY): Payer: Self-pay

## 2013-04-02 DIAGNOSIS — I517 Cardiomegaly: Secondary | ICD-10-CM

## 2013-04-02 DIAGNOSIS — R079 Chest pain, unspecified: Secondary | ICD-10-CM

## 2013-04-02 DIAGNOSIS — M791 Myalgia, unspecified site: Secondary | ICD-10-CM

## 2013-04-02 LAB — COMPREHENSIVE METABOLIC PANEL
ALBUMIN: 4.2 g/dL (ref 3.5–5.2)
ALK PHOS: 142 U/L — AB (ref 39–117)
ALT: 70 U/L — AB (ref 0–35)
AST: 38 U/L — AB (ref 0–37)
BUN: 12 mg/dL (ref 6–23)
CO2: 25 mEq/L (ref 19–32)
Calcium: 9.8 mg/dL (ref 8.4–10.5)
Chloride: 101 mEq/L (ref 96–112)
Creatinine, Ser: 0.87 mg/dL (ref 0.50–1.10)
GFR calc Af Amer: >90 mL/min (ref >90)
GFR calc non Af Amer: >90 mL/min (ref >90)
Glucose, Bld: 103 mg/dL — ABNORMAL HIGH (ref 70–99)
POTASSIUM: 3.7 meq/L (ref 3.7–5.3)
SODIUM: 139 meq/L (ref 137–147)
TOTAL PROTEIN: 7.9 g/dL (ref 6.0–8.3)
Total Bilirubin: 0.3 mg/dL (ref 0.3–1.2)

## 2013-04-02 LAB — CBC WITH DIFFERENTIAL/PLATELET
BASOS ABS: 0.1 10*3/uL (ref 0.0–0.1)
BASOS PCT: 1 % (ref 0–1)
EOS ABS: 0.1 10*3/uL (ref 0.0–0.7)
Eosinophils Relative: 2 % (ref 0–5)
HCT: 39.6 % (ref 36.0–46.0)
Hemoglobin: 13.7 g/dL (ref 12.0–15.0)
Lymphocytes Relative: 37 % (ref 12–46)
Lymphs Abs: 3.5 10*3/uL (ref 0.7–4.0)
MCH: 29.3 pg (ref 26.0–34.0)
MCHC: 34.6 g/dL (ref 30.0–36.0)
MCV: 84.6 fL (ref 78.0–100.0)
Monocytes Absolute: 0.5 10*3/uL (ref 0.1–1.0)
Monocytes Relative: 6 % (ref 3–12)
NEUTROS ABS: 5.2 10*3/uL (ref 1.7–7.7)
NEUTROS PCT: 54 % (ref 43–77)
PLATELETS: 319 10*3/uL (ref 150–400)
RBC: 4.68 MIL/uL (ref 3.87–5.11)
RDW: 12 % (ref 11.5–15.5)
WBC: 9.5 10*3/uL (ref 4.0–10.5)

## 2013-04-02 LAB — TROPONIN I: Troponin I: <0.3 ng/mL (ref <0.30)

## 2013-04-02 LAB — D-DIMER, QUANTITATIVE (NOT AT ARMC)

## 2013-04-02 MED ORDER — FAMOTIDINE 20 MG PO TABS: 20.0000 mg | ORAL_TABLET | Freq: Two times a day (BID) | ORAL | Status: DC

## 2013-04-02 MED ORDER — GI COCKTAIL ~~LOC~~
30.0000 mL | Freq: Once | ORAL | Status: AC
  Administered 2013-04-02: 30 mL via ORAL
  Filled 2013-04-02: qty 30

## 2013-04-02 NOTE — ED Notes (Signed)
Went to do vitals and rounding patient headed to xray.

## 2013-04-02 NOTE — ED Notes (Signed)
MD at bedside. 

## 2013-04-02 NOTE — ED Provider Notes (Signed)
CSN: [id number]     Arrival date & time 04/01/13  2145 History   First MD Initiated Contact with Patient 04/02/13 0014     Chief Complaint  Patient presents with  . Chest Pain  . Shortness of Breath   (Consider location/radiation/quality/duration/timing/severity/associated sxs/prior Treatment) HPI Comments: 23 year old female, history of hypertension currently untreated, history of migraine headaches who presents with a complaint of chest pain. This is left-sided, burning and associated with shortness of breath when she lays down. She says that the pain gets worse when she lays down, better when she sits up, has some radiation of the pain to between her shoulder blades. She denies a personal history of coronary disease or pulmonary embolism both of these things run in her family. This pain is not exertional, it has been present daily, constantly for the last week that seems to be worse this evening. There has been no fevers, no chills, she has had some nausea and has had myalgias. There has been no dysuria, no rashes, no diarrhea. She denies any recent medication changes or recent vaccinations, has not been traveling, has no injuries, no recent surgery, does not take estrogens, has not been immobilized.  Patient is a 23 y.o. female presenting with chest pain and shortness of breath. The history is provided by the patient.  Chest Pain Associated symptoms: shortness of breath   Shortness of Breath Associated symptoms: chest pain     Past Medical History  Diagnosis Date  . Hypertension   . Migraines    Past Surgical History  Procedure Laterality Date  . Tonsillectomy    . Adenoidectomy     History reviewed. No pertinent family history. History  Substance Use Topics  . Smoking status: Never Smoker   . Smokeless tobacco: Not on file  . Alcohol Use: No   OB History   Grav Para Term Preterm Abortions TAB SAB Ect Mult Living                 Review of Systems  Respiratory: Positive  for shortness of breath.   Cardiovascular: Positive for chest pain.  All other systems reviewed and are negative.    Allergies  Review of patient's allergies indicates no known allergies.  Home Medications   Current Outpatient Rx  Name  Route  Sig  Dispense  Refill  . Cyanocobalamin (VITAMIN B-12 SL)   Sublingual   Place 3 tablets under the tongue daily.           . cyclobenzaprine (FLEXERIL) 5 MG tablet   Oral   Take 5 mg by mouth daily as needed (for migraine pain).         . famotidine (PEPCID) 20 MG tablet   Oral   Take 1 tablet (20 mg total) by mouth 2 (two) times daily.   30 tablet   0   . HYDROcodone-acetaminophen (NORCO/VICODIN) 5-325 MG per tablet   Oral   Take 1-2 tablets by mouth every 6 (six) hours as needed for pain.   15 tablet   0   . ibuprofen (ADVIL,MOTRIN) 200 MG tablet   Oral   Take 400-800 mg by mouth as needed for pain (migraine pain). For pain         . naproxen (NAPROSYN) 500 MG tablet   Oral   Take 1 tablet (500 mg total) by mouth 2 (two) times daily.   14 tablet   0   . vitamin E 100 UNIT capsule   Oral  Take 100 Units by mouth daily.          BP 106/67  Pulse 81  Temp(Src) 97.8 F (36.6 C) (Oral)  Resp 20  Ht 5\' 6"  (1.676 m)  Wt 244 lb 3.2 oz (110.768 kg)  BMI 39.43 kg/m2  SpO2 97%  LMP 03/25/2013 Physical Exam  Nursing note and vitals reviewed. Constitutional: She appears well-developed and well-nourished. No distress.  HENT:  Head: Normocephalic and atraumatic.  Mouth/Throat: Oropharynx is clear and moist. No oropharyngeal exudate.  Eyes: Conjunctivae and EOM are normal. Pupils are equal, round, and reactive to light. Right eye exhibits no discharge. Left eye exhibits no discharge. No scleral icterus.  Neck: Normal range of motion. Neck supple. No JVD present. No thyromegaly present.  Cardiovascular: Normal rate, regular rhythm, normal heart sounds and intact distal pulses.  Exam reveals no gallop and no friction  rub.   No murmur heard. Pulmonary/Chest: Effort normal and breath sounds normal. No respiratory distress. She has no wheezes. She has no rales.  Abdominal: Soft. Bowel sounds are normal. She exhibits no distension and no mass. There is no tenderness.  Musculoskeletal: Normal range of motion. She exhibits no edema and no tenderness.  Lymphadenopathy:    She has no cervical adenopathy.  Neurological: She is alert. Coordination normal.  Skin: Skin is warm and dry. No rash noted. No erythema.  Psychiatric: She has a normal mood and affect. Her behavior is normal.    ED Course  Procedures (including critical care time) Labs Review Labs Reviewed  COMPREHENSIVE METABOLIC PANEL - Abnormal; Notable for the following:    Glucose, Bld 103 (*)    AST 38 (*)    ALT 70 (*)    Alkaline Phosphatase 142 (*)    All other components within normal limits  CBC WITH DIFFERENTIAL  D-DIMER, QUANTITATIVE  TROPONIN I   Imaging Review Dg Chest 2 View  04/02/2013   CLINICAL DATA:  Shortness of breath  EXAM: CHEST  2 VIEW  COMPARISON:  08/21/2012  FINDINGS: The heart size and mediastinal contours are within normal limits. Both lungs are clear. The visualized skeletal structures are unremarkable.  IMPRESSION: No active cardiopulmonary disease.   Electronically Signed   By: Ruel Favors M.D.   On: 04/02/2013 01:44    EKG Interpretation    Date/Time:  Monday April 01 2013 21:53:10 EST Ventricular Rate:  78 PR Interval:  132 QRS Duration: 84 QT Interval:  402 QTC Calculation: 458 R Axis:     Text Interpretation:  Normal sinus rhythm Left ventricular hypertrophy Abnormal ECG When compared with ECG of 21-Aug-2012 14:07, Vent. rate has decreased BY  47 BPM LVH now present Confirmed by Jakoby Melendrez  MD, [place] ([id number]) on 04/02/2013 12:58:46 AM            MDM   1. Chest pain   2. LVH (left ventricular hypertrophy)   3. Myalgia    On exam the patient appears benign, her EKG is unremarkable except for LVH  which was not seen on prior EKG. Her blood pressure is normal, labs and chest x-ray pending. We'll rule out pulmonary embolism, much less likely to be aortic dissection given her age, normal vital signs. Unlikely to be cardiac in nature given the not exertional component which appears to be more positional. Possibly related to a gastrointestinal illness or acid reflux abnormality.  D-dimer normal, troponin normal, labs normal. X-ray shows no signs of cardiomegaly. EKG does show left ventricular hypertrophy, I have informed the  patient is results and encouraged her to followup for further testing including a possible echocardiogram. Given that her symptoms have been persistent for greater than several days I doubt this is an acute coronary syndrome with a nonischemic EKG and a normal troponin. We'll start Pepcid in the outpatient setting  Meds given in ED:  Medications  gi cocktail (Maalox,Lidocaine,Donnatal) (30 mLs Oral Given 04/02/13 0110)    New Prescriptions   FAMOTIDINE (PEPCID) 20 MG TABLET    Take 1 tablet (20 mg total) by mouth 2 (two) times daily.        Vida Roller, MD 04/02/13 [phone]

## 2013-04-02 NOTE — Discharge Instructions (Signed)
Your testing has not shown an obvious source of your symptoms. Your EKG does show that you have a possible enlarged heart, this does need to be reevaluated and recheck by your family doctor. If you do not have a family doctor see the list of doctors below or go to the health Department where they can see you despite not having insurance.  Please call your doctor for a followup appointment within 24-48 hours. When you talk to your doctor please let them know that you were seen in the emergency department and have them acquire all of your records so that they can discuss the findings with you and formulate a treatment plan to fully care for your new and ongoing problems.  [facility] [facility]    Kari BaarsEdward Hawkins MD. Specialty: Pulmonary Disease Contact information: [address]  [address]  [place] [address]  [phone]   Syliva OvermanMargaret Simpson, MD. Specialty: [facility] Contact information: [address]  [place] [address]  [phone]   Lilyan PuntScott Luking, MD. Specialty: [facility] Contact information: [address]  [address]  [place] [address]  [phone]   Avon Gullyesfaye Fanta, MD Specialty: Internal Medicine Contact information: [address]  [place] [address]  [phone]   Catalina PizzaZach Hall, MD. Specialty: Internal Medicine Contact information: [address]  [place] [address]  [phone]   Butch PennyAngus Mcinnis, MD. Specialty: Family Medicine Contact information: [address]  [place] [address]  [phone]   John GiovanniStephen Knowlton, MD. Specialty: [facility] Contact information: [address]  [address]  [place] [address]  [phone]   Carylon Perchesoy Fagan, MD. Specialty: Internal Medicine Contact information: [address]  [address]  [place] [address]  [phone]

## 2013-04-02 NOTE — ED Notes (Signed)
Patient transported to X-ray 

## 2013-04-02 NOTE — ED Notes (Signed)
Patient back from radiology.

## 2014-10-26 ENCOUNTER — Emergency Department (HOSPITAL_COMMUNITY)
Admission: EM | Admit: 2014-10-26 | Discharge: 2014-10-26 | Disposition: A | Discharge status: Home / Self Care | Payer: Self-pay | Attending: Emergency Medicine | Admitting: Emergency Medicine

## 2014-10-26 ENCOUNTER — Emergency Department (HOSPITAL_COMMUNITY): Payer: Self-pay

## 2014-10-26 ENCOUNTER — Encounter (HOSPITAL_COMMUNITY): Payer: Self-pay | Admitting: Emergency Medicine

## 2014-10-26 DIAGNOSIS — R1010 Upper abdominal pain, unspecified: Secondary | ICD-10-CM | POA: Insufficient documentation

## 2014-10-26 DIAGNOSIS — Z791 Long term (current) use of non-steroidal anti-inflammatories (NSAID): Secondary | ICD-10-CM | POA: Insufficient documentation

## 2014-10-26 DIAGNOSIS — Z3202 Encounter for pregnancy test, result negative: Secondary | ICD-10-CM | POA: Insufficient documentation

## 2014-10-26 DIAGNOSIS — Z79899 Other long term (current) drug therapy: Secondary | ICD-10-CM | POA: Insufficient documentation

## 2014-10-26 DIAGNOSIS — R1011 Right upper quadrant pain: Secondary | ICD-10-CM

## 2014-10-26 DIAGNOSIS — K8020 Calculus of gallbladder without cholecystitis without obstruction: Secondary | ICD-10-CM

## 2014-10-26 DIAGNOSIS — G43909 Migraine, unspecified, not intractable, without status migrainosus: Secondary | ICD-10-CM | POA: Insufficient documentation

## 2014-10-26 DIAGNOSIS — I10 Essential (primary) hypertension: Secondary | ICD-10-CM | POA: Insufficient documentation

## 2014-10-26 DIAGNOSIS — R1013 Epigastric pain: Secondary | ICD-10-CM

## 2014-10-26 LAB — COMPREHENSIVE METABOLIC PANEL WITH GFR
ALT: 43 U/L (ref 14–54)
AST: 34 U/L (ref 15–41)
Albumin: 4.2 g/dL (ref 3.5–5.0)
Alkaline Phosphatase: 103 U/L (ref 38–126)
Anion gap: 8 (ref 5–15)
BUN: 16 mg/dL (ref 6–20)
CO2: 24 mmol/L (ref 22–32)
Calcium: 8.9 mg/dL (ref 8.9–10.3)
Chloride: 105 mmol/L (ref 101–111)
Creatinine, Ser: 0.86 mg/dL (ref 0.44–1.00)
GFR calc Af Amer: >60 mL/min (ref >60)
GFR calc non Af Amer: >60 mL/min (ref >60)
Glucose, Bld: 106 mg/dL — ABNORMAL HIGH (ref 65–99)
Potassium: 4 mmol/L (ref 3.5–5.1)
Sodium: 137 mmol/L (ref 135–145)
Total Bilirubin: 0.3 mg/dL (ref 0.3–1.2)
Total Protein: 7.3 g/dL (ref 6.5–8.1)

## 2014-10-26 LAB — URINE MICROSCOPIC-ADD ON

## 2014-10-26 LAB — CBC WITH DIFFERENTIAL/PLATELET
BASOS ABS: 0 10*3/uL (ref 0.0–0.1)
BASOS PCT: 0 % (ref 0–1)
EOS ABS: 0.2 10*3/uL (ref 0.0–0.7)
Eosinophils Relative: 2 % (ref 0–5)
HEMATOCRIT: 40.7 % (ref 36.0–46.0)
HEMOGLOBIN: 14.1 g/dL (ref 12.0–15.0)
Lymphocytes Relative: 34 % (ref 12–46)
Lymphs Abs: 2.9 10*3/uL (ref 0.7–4.0)
MCH: 30 pg (ref 26.0–34.0)
MCHC: 34.6 g/dL (ref 30.0–36.0)
MCV: 86.6 fL (ref 78.0–100.0)
Monocytes Absolute: 0.7 10*3/uL (ref 0.1–1.0)
Monocytes Relative: 8 % (ref 3–12)
NEUTROS ABS: 4.8 10*3/uL (ref 1.7–7.7)
NEUTROS PCT: 56 % (ref 43–77)
Platelets: 291 10*3/uL (ref 150–400)
RBC: 4.7 MIL/uL (ref 3.87–5.11)
RDW: 12.2 % (ref 11.5–15.5)
WBC: 8.6 10*3/uL (ref 4.0–10.5)

## 2014-10-26 LAB — URINALYSIS, ROUTINE W REFLEX MICROSCOPIC
BILIRUBIN URINE: NEGATIVE
Glucose, UA: NEGATIVE mg/dL
Hgb urine dipstick: NEGATIVE
KETONES UR: NEGATIVE mg/dL
NITRITE: NEGATIVE
PH: 6 (ref 5.0–8.0)
PROTEIN: NEGATIVE mg/dL
Specific Gravity, Urine: <1.005 — ABNORMAL LOW (ref 1.005–1.030)
UROBILINOGEN UA: 0.2 mg/dL (ref 0.0–1.0)

## 2014-10-26 LAB — PREGNANCY, URINE: PREG TEST UR: NEGATIVE

## 2014-10-26 LAB — LIPASE, BLOOD: Lipase: 21 U/L — ABNORMAL LOW (ref 22–51)

## 2014-10-26 MED ORDER — ONDANSETRON HCL 4 MG/2ML IJ SOLN
4.0000 mg | INTRAMUSCULAR | Status: DC | PRN
  Administered 2014-10-26: 4 mg via INTRAVENOUS
  Filled 2014-10-26: qty 2

## 2014-10-26 MED ORDER — FAMOTIDINE IN NACL 20-0.9 MG/50ML-% IV SOLN
20.0000 mg | Freq: Once | INTRAVENOUS | Status: AC
  Administered 2014-10-26: 20 mg via INTRAVENOUS
  Filled 2014-10-26: qty 50

## 2014-10-26 MED ORDER — FENTANYL CITRATE (PF) 100 MCG/2ML IJ SOLN
12.5000 ug | INTRAMUSCULAR | Status: DC | PRN
  Administered 2014-10-26: 12.5 ug via INTRAVENOUS
  Filled 2014-10-26: qty 2

## 2014-10-26 MED ORDER — HYDROCODONE-ACETAMINOPHEN 5-325 MG PO TABS: ORAL_TABLET | ORAL | Status: DC

## 2014-10-26 MED ORDER — MORPHINE SULFATE (PF) 4 MG/ML IV SOLN
4.0000 mg | INTRAVENOUS | Status: DC | PRN
  Administered 2014-10-26: 4 mg via INTRAVENOUS
  Filled 2014-10-26: qty 1

## 2014-10-26 MED ORDER — ONDANSETRON HCL 4 MG PO TABS: 4.0000 mg | ORAL_TABLET | Freq: Three times a day (TID) | ORAL | Status: DC | PRN

## 2014-10-26 NOTE — ED Notes (Signed)
Patient given water for PO challenge.  

## 2014-10-26 NOTE — ED Provider Notes (Signed)
CSN: [id number]     Arrival date & time 10/26/14  5621 History   First MD Initiated Contact with Patient 10/26/14 0848     Chief Complaint  Patient presents with  . Abdominal Pain     HPI Pt was seen at 0910.  Per pt, c/o gradual onset and persistence of constant upper abd "pain" since this morning.  Has been associated with nausea.  Describes the abd pain as "sharp." Pain is located in her mid-epigastric and RUQ areas. Last meal was 2100 last night, and she laid down to bed at midnight. States she "ate out a lot yesterday because it was my birthday." States she has had similar symptoms intermittently over the past 1 year, usually occurring after eating greasy/fatty/fried foods. Denies vomiting/diarrhea, no fevers, no back pain, no rash, no CP/SOB, no black or blood in stools.       Past Medical History  Diagnosis Date  . Hypertension   . Migraines    Past Surgical History  Procedure Laterality Date  . Tonsillectomy    . Adenoidectomy      Social History  Substance Use Topics  . Smoking status: Never Smoker   . Smokeless tobacco: None  . Alcohol Use: No    Review of Systems ROS: Statement: All systems negative except as marked or noted in the HPI; Constitutional: Negative for fever and chills. ; ; Eyes: Negative for eye pain, redness and discharge. ; ; ENMT: Negative for ear pain, hoarseness, nasal congestion, sinus pressure and sore throat. ; ; Cardiovascular: Negative for chest pain, palpitations, diaphoresis, dyspnea and peripheral edema. ; ; Respiratory: Negative for cough, wheezing and stridor. ; ; Gastrointestinal: +nausea, abd pain. Negative for vomiting, diarrhea, blood in stool, hematemesis, jaundice and rectal bleeding. . ; ; Genitourinary: Negative for dysuria, flank pain and hematuria. ; ; Musculoskeletal: Negative for back pain and neck pain. Negative for swelling and trauma.; ; Skin: Negative for pruritus, rash, abrasions, blisters, bruising and skin lesion.; ; Neuro:  Negative for headache, lightheadedness and neck stiffness. Negative for weakness, altered level of consciousness , altered mental status, extremity weakness, paresthesias, involuntary movement, seizure and syncope.      Allergies  Review of patient's allergies indicates no known allergies.  Home Medications   Prior to Admission medications   Medication Sig Start Date End Date Taking? Authorizing Provider  Cyanocobalamin (VITAMIN B-12 SL) Place 3 tablets under the tongue daily.      Historical Provider, MD  cyclobenzaprine (FLEXERIL) 5 MG tablet Take 5 mg by mouth daily as needed (for migraine pain).    Historical Provider, MD  famotidine (PEPCID) 20 MG tablet Take 1 tablet (20 mg total) by mouth 2 (two) times daily. 04/02/13   Eber Hong, MD  HYDROcodone-acetaminophen (NORCO/VICODIN) 5-325 MG per tablet Take 1-2 tablets by mouth every 6 (six) hours as needed for pain. 08/21/12   Vanetta Mulders, MD  ibuprofen (ADVIL,MOTRIN) 200 MG tablet Take 400-800 mg by mouth as needed for pain (migraine pain). For pain    Historical Provider, MD  naproxen (NAPROSYN) 500 MG tablet Take 1 tablet (500 mg total) by mouth 2 (two) times daily. 08/21/12   Vanetta Mulders, MD  vitamin E 100 UNIT capsule Take 100 Units by mouth daily.    Historical Provider, MD   BP 124/80 mmHg  Pulse 73  Temp(Src) 98.2 F (36.8 C) (Oral)  Resp 16  Ht 5\' 6"  (1.676 m)  Wt 250 lb (113.399 kg)  BMI 40.37 kg/m2  SpO2  100%  LMP 09/21/2014 Physical Exam  0915: Physical examination:  Nursing notes reviewed; Vital signs and O2 SAT reviewed;  Constitutional: Well developed, Well nourished, Well hydrated, Uncomfortable appearing.; Head:  Normocephalic, atraumatic; Eyes: EOMI, PERRL, No scleral icterus; ENMT: Mouth and pharynx normal, Mucous membranes moist; Neck: Supple, Full range of motion, No lymphadenopathy; Cardiovascular: Regular rate and rhythm, No murmur, rub, or gallop; Respiratory: Breath sounds clear & equal bilaterally, No  rales, rhonchi, wheezes.  Speaking full sentences with ease, Normal respiratory effort/excursion; Chest: Nontender, Movement normal; Abdomen: Soft, +mid-epigastric and RUQ tenderness to palp. Nondistended, Normal bowel sounds; Genitourinary: No CVA tenderness; Extremities: Pulses normal, No tenderness, No edema, No calf edema or asymmetry.; Neuro: AA&Ox3, Major CN grossly intact.  Speech clear. No gross focal motor or sensory deficits in extremities. Climbs on and off stretcher easily by herself. Gait steady.; Skin: Color normal, Warm, Dry.   ED Course  Procedures (including critical care time) Labs Review  Imaging Review  I have personally reviewed and evaluated these images and lab results as part of my medical decision-making.   EKG Interpretation None      MDM  MDM Reviewed: previous chart, nursing note and vitals Reviewed previous: labs Interpretation: labs and ultrasound      Results for orders placed or performed during the hospital encounter of 10/26/14  Pregnancy, urine  Result Value Ref Range   Preg Test, Ur NEGATIVE NEGATIVE  Urinalysis, Routine w reflex microscopic (not at [facility])  Result Value Ref Range   Color, Urine STRAW (A) YELLOW   APPearance CLEAR CLEAR   Specific Gravity, Urine <1.005 (L) 1.005 - 1.030   pH 6.0 5.0 - 8.0   Glucose, UA NEGATIVE NEGATIVE mg/dL   Hgb urine dipstick NEGATIVE NEGATIVE   Bilirubin Urine NEGATIVE NEGATIVE   Ketones, ur NEGATIVE NEGATIVE mg/dL   Protein, ur NEGATIVE NEGATIVE mg/dL   Urobilinogen, UA 0.2 0.0 - 1.0 mg/dL   Nitrite NEGATIVE NEGATIVE   Leukocytes, UA SMALL (A) NEGATIVE  Comprehensive metabolic panel  Result Value Ref Range   Sodium 137 135 - 145 mmol/L   Potassium 4.0 3.5 - 5.1 mmol/L   Chloride 105 101 - 111 mmol/L   CO2 24 22 - 32 mmol/L   Glucose, Bld 106 (H) 65 - 99 mg/dL   BUN 16 6 - 20 mg/dL   Creatinine, Ser 4.09 0.44 - 1.00 mg/dL   Calcium 8.9 8.9 - 81.1 mg/dL   Total Protein 7.3 6.5 - 8.1 g/dL    Albumin 4.2 3.5 - 5.0 g/dL   AST 34 15 - 41 U/L   ALT 43 14 - 54 U/L   Alkaline Phosphatase 103 38 - 126 U/L   Total Bilirubin 0.3 0.3 - 1.2 mg/dL   GFR calc non Af Amer >60 >60 mL/min   GFR calc Af Amer >60 >60 mL/min   Anion gap 8 5 - 15  Lipase, blood  Result Value Ref Range   Lipase 21 (L) 22 - 51 U/L  CBC with Differential  Result Value Ref Range   WBC 8.6 4.0 - 10.5 K/uL   RBC 4.70 3.87 - 5.11 MIL/uL   Hemoglobin 14.1 12.0 - 15.0 g/dL   HCT 91.4 78.2 - 95.6 %   MCV 86.6 78.0 - 100.0 fL   MCH 30.0 26.0 - 34.0 pg   MCHC 34.6 30.0 - 36.0 g/dL   RDW 21.3 08.6 - 57.8 %   Platelets 291 150 - 400 K/uL   Neutrophils Relative %  56 43 - 77 %   Neutro Abs 4.8 1.7 - 7.7 K/uL   Lymphocytes Relative 34 12 - 46 %   Lymphs Abs 2.9 0.7 - 4.0 K/uL   Monocytes Relative 8 3 - 12 %   Monocytes Absolute 0.7 0.1 - 1.0 K/uL   Eosinophils Relative 2 0 - 5 %   Eosinophils Absolute 0.2 0.0 - 0.7 K/uL   Basophils Relative 0 0 - 1 %   Basophils Absolute 0.0 0.0 - 0.1 K/uL  Urine microscopic-add on  Result Value Ref Range   Squamous Epithelial / LPF FEW (A) RARE   WBC, UA 3-6 <3 WBC/hpf   Bacteria, UA FEW (A) RARE   US Abdomen Complete 10/26/2014   CLINICAL DATA:  Acute onset right upper quadrant and epigastric pain earlier today.  EXAM: ULTRASOUND ABDOMEN COMPLETE  COMPARISON:  CT on 08/21/2012  FINDINGS: Gallbladder: Several calcified gallstones are again seen measuring up to 2 cm. One stone measuring 1.8 cm is seen in the gallbladder neck which is non mobile. No evidence of gallbladder wall thickening or pericholecystic fluid. Positive sonographic Murphy sign noted by sonographer.  Common bile duct: Diameter: 4 mm, within normal limits.  Liver: Diffusely increased echogenicity of the hepatic parenchyma, consistent with diffuse hepatic steatosis/hepatocellular disease. No focal mass lesion identified.  IVC: No abnormality visualized.  Pancreas: Not well visualized due to overlying bowel gas.  Spleen:  Size and appearance within normal limits.  Right Kidney: Length: 11.2 cm. Echogenicity within normal limits. No mass or hydronephrosis visualized.  Left Kidney: Length: 12.5 cm. Echogenicity within normal limits. No mass or hydronephrosis visualized.  Abdominal aorta: No aneurysm visualized.  Other findings: None.  IMPRESSION: Cholelithiasis. No evidence of gallbladder wall thickening, however positive sonographic Murphy sign noted by sonographer. Recommend clinical correlation, and consider nuclear medicine hepatobiliary scan for further evaluation if clinically warranted.  No evidence of biliary ductal dilatation.  Diffuse hepatic steatosis.   Electronically Signed   By: Myles Rosenthal M.D.   On: 10/26/2014 10:52    1210:  Udip contaminated. Pt has tol PO well while in the ED without N/V.  No stooling while in the ED.  Abd benign, VSS. Feels better and wants to go home now. Tx symptomatically at this time. Dx and testing d/w pt and family.  Questions answered.  Verb understanding, agreeable to d/c home with outpt f/u.    Samuel Jester, DO 10/29/14 1520

## 2014-10-26 NOTE — ED Notes (Signed)
Pt requesting 2nd dose of pain medication.  Dr Clarene Duke informed of pts last reaction, hold 2nd dose of morphine, will re-evaluate.

## 2014-10-26 NOTE — ED Notes (Signed)
Pt c/o feeling funny following administration of morphine.

## 2014-10-26 NOTE — Discharge Instructions (Signed)
°Emergency Department Resource Guide °1) Find a Doctor and Pay Out of Pocket °Although you won't have to find out who is covered by your insurance plan, it is a good idea to ask around and get recommendations. You will then need to call the office and see if the doctor you have chosen will accept you as a new patient and what types of options they offer for patients who are self-pay. Some doctors offer discounts or will set up payment plans for their patients who do not have insurance, but you will need to ask so you aren't surprised when you get to your appointment. ° °2) Contact Your Local Health Department °Not all health departments have doctors that can see patients for sick visits, but many do, so it is worth a call to see if yours does. If you don't know where your local health department is, you can check in your phone book. The CDC also has a tool to help you locate your state's health department, and many state websites also have listings of all of their local health departments. ° °3) Find a Walk-in Clinic °If your illness is not likely to be very severe or complicated, you may want to try a walk in clinic. These are popping up all over the country in pharmacies, drugstores, and shopping centers. They're usually staffed by nurse practitioners or physician assistants that have been trained to treat common illnesses and complaints. They're usually fairly quick and inexpensive. However, if you have serious medical issues or chronic medical problems, these are probably not your best option. ° °No Primary Care Doctor: °- Call [facility] at  832-8000 - they can help you locate a primary care doctor that  accepts your insurance, provides certain services, etc. °- Physician Referral Service- 1-800-533-3463 ° °Chronic Pain Problems: °Organization         Address  Phone   Notes  °[facility]  ([phone]) [phone] Patients need to be referred by their primary care doctor.  ° °Medication  Assistance: °Organization         Address  Phone   Notes  °[place] [facility] [address] °[place], [place] [address] (336) 641-8030 --Must be a resident of [place] °-- Must have NO insurance coverage whatsoever (no Medicaid/ Medicare, etc.) °-- The pt. MUST have a primary care doctor that directs their care regularly and follows them in the community °  °[facility]  (866) 331-1348   °[employer]  (888) 892-1162   ° °Agencies that provide inexpensive medical care: °Organization         Address  Phone   Notes  °[facility]  (336) 832-8035   °[facility]    (336) 832-7272   °[facility] [address] °[place], [place] [address] (336) 832-4777   °[facility] [address], °[place] (336) 271-4999   °[facility]    (336) 373-0678   °[facility]    (336) 272-1050   °[facility] ° [address], [place] Phone:  (336) 832-4444, Fax:  (336) 832-4440 Hours of Operation:  9 am - 6 pm, M-F.  Also accepts Medicaid/Medicare and self-pay.  °[facility] ° [address], [place] Phone: (336) [phone], Fax: (336) [phone]. Hours of Operation:  8:30 am - 5:30 pm, M-F.  Also accepts Medicaid and self-pay.  °[facility] [place] [address], [place] Phone: (336) 878-6027   °[facility] [address], [place], [place] (336)723-1848, Ext. 123 Mondays & Thursdays: 7-9 AM.  First 15 patients are seen on a first come, first serve basis. °  ° °Medicaid-accepting [place] Providers: ° °Organization         Address  Phone   Notes  °[facility] [address], [place] (336) [phone] Also accepts self-pay patients.  °[facility] [address], [place] ° (336) 856-9996   °[facility] [address], [place]  (336) 288-8857   °[facility] [address] [place] [address], [place] (336) 299-7000   °Veita Bland [address], [place]  ° (336) 373-1557 Only accepts [place] Access Medicaid patients after they have their name applied to their card.  ° °Self-Pay (no insurance) in [place]: ° °Organization         Address  Phone   Notes  °Sickle Cell Patients, [facility] [address], [place] (336) 832-1970   °[facility] [address], [place] (336) 832-4400   °[facility] ° [address], [place] (336) 992-4800   °[facility]/Dr. Osei-Bonsu ° [address] [place] [address], [place] or [address], [place] (336) [phone] Phone number for both [place] and [place] locations is the same.  °[facility] [address], [place] (336) 299-0000   °[facility] [address] [place] [address], [place] or [address] (336) 852-7530 °(336) 878-2260   °[facility] [address], [place] (336) 350-1642, phone; (336) 294-5005, fax Sees patients 1st and 3rd Saturday of every month.  Must not qualify for public or private insurance (i.e. Medicaid, Medicare, [place] Health Choice, Veterans' Benefits) • Household income should be no more than 200% of the poverty level •The clinic cannot treat you if you are pregnant or think you are pregnant • Sexually transmitted diseases are not treated at the clinic.  ° ° °Dental Care: °Organization         Address  Phone  Notes  °[place] [facility] [address], [place] (336) 641-6152 Accepts children up to age 21 who are enrolled in Medicaid or [place] Health Choice; pregnant women with a Medicaid card; and children who have applied for Medicaid or [place] Health Choice, but were declined, whose parents can pay a reduced fee at time of service.  °[place]  [facility] [place]  [address], [place] (336) 641-7733 Accepts children up to age 21 who are enrolled in Medicaid or [place] Health Choice; pregnant women with a Medicaid card; and children who have applied for Medicaid or [place] Health Choice, but were declined, whose parents can pay a reduced fee at time of service.  °[facility] ° [address], [place] (336) 641-4533 Patients are seen by appointment only. Walk-ins are not accepted. [facility] will see patients 18 years of age and older. °Monday - Tuesday (8am-5pm) °Most Wednesdays (8:30-5pm) °$30 per visit, cash only  °[facility] ° [address], [place] (336) 641-4533 Patients are seen by appointment only. Walk-ins are not accepted. [facility] will see patients 18 years of age and older. °One   Wednesday Evening (Monthly: Volunteer Based).  $30 per visit, cash only  °[facility]  (919) 537-3737 for adults; Children under age 4, call Graduate Pediatric Dentistry at ([phone]) [phone]. Children aged 4-14, please call ([phone]) [phone] to request a pediatric application. ° Dental services are provided in all areas of dental care including fillings, crowns and bridges, complete and partial dentures, implants, gum treatment, root canals, and extractions. Preventive care is also provided. Treatment is provided to both adults and children. °Patients are selected via a lottery and there is often a waiting list. °  °[facility] [address], °[place] ° (336) 763-8833 [email or web address] °  °[facility] [address], [place], [place] (336)723-1848, Ext. 123 Second and Fourth Thursday of each month, opens at 6:30 AM; Clinic ends at 9 AM.  Patients are seen on a first-come first-served basis, and a limited number are seen during each clinic.  ° °[facility] ° [address], [place], [place] (336) [phone]    Eligibility Requirements °You must have lived in [place], [place], or [place] counties for at least the last three months. °  You cannot be eligible for state or federal sponsored healthcare insurance, including Veterans Administration, Medicaid, or Medicare. °  You generally cannot be eligible for healthcare insurance through your employer.  °  How to apply: °Eligibility screenings are held every Tuesday and Wednesday afternoon from 1:00 pm until 4:00 pm. You do not need an appointment for the interview!  °[facility] [address], [place], [place] 336-631-2330   °[facility]  336-342-8273   °[facility]  336-703-3100   °[facility]  336-570-6415   ° °Behavioral Health Resources in the Community: °Intensive Outpatient Programs °Organization         Address  Phone  Notes  °[facility] [address], [place], [place] 336-878-6098   °[facility] [address], [place], [place] 336-832-9800   °[facility] [address], [place], [place] ° 336-882-2125   °[facility] [address],  °[place], [place] 1-800-853-5163 or 336-641-4981   °Substance Abuse Resources °Organization         Address  Phone  Notes  °[facility]  336-882-2125   °[facility]  336-784-9470   °[facility]  336-285-9073   °[facility]  336-845-3988   °Residential & Outpatient Substance Abuse Program  1-800-659-3381   °Psychological Services °Organization         Address  Phone  Notes  °[facility]  336- 832-9600   °[facility]  336- 378-7881   °[facility] [address], [place] 1-800-853-5163 or 336-641-4981   ° °Mobile Crisis Teams °Organization         Address  Phone  Notes  °[facility], Mobile Crisis Care Unit  1-877-626-1772   °Assertive °[facility] ° [address]  [place], [place] 336-834-9664   °Sharon DeEsch [address] °[place] 336-554-5454   ° °Self-Help/Support Groups °Organization         Address  Phone             Notes  °[facility]  - variety of support groups  336- 373-1402 Call for more information  °Narcotics Anonymous (NA), [facility] [address], °[place]  2 meetings at this location  ° °  Residential Treatment Programs Organization         Address  Phone  Notes  [facility] [address],    [place]  4-098-119-1478   [facility]  [address] [place] [address], [place], [place] 308-657-8469   [facility] [address] [place], [place] 629-528-4132 Admissions: 8am-3pm M-F  [facility] [address]    [place], [place] 440-102-7253   [facility] [address] [place], [place], [place] [phone]   [facility] [address],  [place], [place] 595-638-7564   [facility] Intensive Outpatient [address], [place], [place] 332-951-8841   [facility] ([facility] [address]  [place], [place] 6-606-301-6010 or (336)345-4304   [facility] [address], [place], [place] 025-427-0623 Accepts Medicaid  [facility] [address]  [place] 7-628-315-1761 Substance Abuse/Addiction Treatment   [facility] Organization         Address  Phone  Notes  [facility]  825-686-9759   Angie Fava, PhD [address] [place], [place]   331-749-0940 or 989-263-3517   [facility]   [address] [place], [place] [phone]   [facility] [address] [place], [place] 321-542-9711 Insurance/Medicaid/sponsorship through [facility] [address]                                    [place], [place] [phone] Therapy/tele-psych/case    [facility] [address], [place] (347)[phone]    Dr. Lolly Mustache  (450)[phone]   [facility]  [employer] [facility] Dept. 1) [address] [place] 2) [address] [place] 3)  [address], [place] 947-105-2430 (603)[phone]  (613)692-9272   [facility] Child Abuse Hotline 636-559-9949 or 832 148 9569 (After Hours)      Eat a bland diet, avoiding greasy, fatty, fried foods, as well as spicy and acidic foods or beverages.  Avoid eating within the hour or 2 before going to bed or laying down.  Also avoid teas, colas, coffee, chocolate, pepermint and spearment.  Take over the counter pepcid, one tablet by mouth twice a day, for the next 2 to 3 weeks.  May also take over the counter maalox/mylanta, as directed on packaging, as needed for discomfort.  Take the prescriptions as directed.  Call the General Surgeon and your regular medical doctor tomorrow to schedule a follow up appointment this week.  Return to the Emergency Department immediately if worsening.

## 2014-10-26 NOTE — ED Notes (Signed)
Patient with c/o epigastric to RUQ abdominal pain that woke her up this morning. Denies N/V/D. States previous episodes that has resolved before.

## 2014-12-02 NOTE — H&P (Signed)
  NTS SOAP Note  Vital Signs:  Vitals as of: 12/02/2014: Systolic 123: Diastolic 73: Heart Rate 81: Temp 97.49F: Height 4ft 6in: Weight 246Lbs 0 Ounces: BMI 39.71  BMI : 39.71 kg/m2  Subjective: This 24 year old female presents for of biliary colic.  Was seen in the ER recently for right upper quadrant abdominal pain.  Found on u/s to have cholelithiasis, normal common bile duct.  Has had intermittent right upper quadrant abdominal pain, nausea, fatty food intolerance for some time now.  No fever, chills, jaundice.  Review of Symptoms:  Constitutional:fatigue Head:unremarkable Eyes:blurred vision bilateral Nose/Mouth/Throat:unremarkable Cardiovascular:  unremarkable Respiratory:unremarkable Gastrointestinabdominal pain, nausea, heartburn, dyspepsia Genitourinary:frequency joint, back, and neck pain Skin:unremarkable Hematolgic/Lymphatic:unremarkable   Allergic/Immunologic:unremarkable   Past Medical History:  Reviewed  Past Medical History  Surgical History: oral surgery Medical Problems: none Allergies: morphine Medications: none   Social History:Reviewed  Social History  Preferred Language: English Race:  White Ethnicity: Not Hispanic / Latino Age: 64 year Marital Status:  M Alcohol: no   Smoking Status: Never smoker reviewed on 12/02/2014 Functional Status reviewed on 12/02/2014 ------------------------------------------------ Bathing: Normal Cooking: Normal Dressing: Normal Driving: Normal Eating: Normal Managing Meds: Normal Oral Care: Normal Shopping: Normal Toileting: Normal Transferring: Normal Walking: Normal Cognitive Status reviewed on 12/02/2014 ------------------------------------------------ Attention: Normal Decision Making: Normal Language: Normal Memory: Normal Motor: Normal Perception: Normal Problem Solving: Normal Visual and Spatial: Normal   Family History:Reviewed  Family Health History Family History  is Unknown    Objective Information: General:Well appearing, well nourished in no distress. Heart:RRR, no murmur or gallop.  Normal S1, S2.  No S3, S4.  Lungs:  CTA bilaterally, no wheezes, rhonchi, rales.  Breathing unlabored. Abdomen:Soft, NT/ND, normal bowel sounds, no HSM, no masses.  No peritoneal signs.  Assessment:Gallstones  Diagnoses: 574.20  K80.20 Gallstone (Calculus of gallbladder without cholecystitis without obstruction)  Procedures: 16109 - OFFICE OUTPATIENT NEW 30 MINUTES    Plan:  Scheduled for laparoscopic cholecystectomy on 12/08/14.   Patient Education:Alternative treatments to surgery were discussed with patient (and family).  Risks and benefits  of procedure including bleeding, infection, hepatobiliary injury, and the possibility of an open procedure were fully explained to the patient (and family) who gave informed consent. Patient/family questions were addressed.  Follow-up:Pending Surgery

## 2014-12-03 NOTE — Patient Instructions (Signed)
Jennifer Briggs  12/03/2014     @   Your procedure is scheduled on 12/08/2014  Report to [name] at 9:00 A.M.  Call this number if you have problems the morning of surgery:  [phone]   Remember:  Do not eat food or drink liquids after midnight.  Take these medicines the morning of surgery with A SIP OF WATER N/A   Do not wear jewelry, make-up or nail polish.  Do not wear lotions, powders, or perfumes.  You may wear deodorant.  Do not shave 48 hours prior to surgery.  Men may shave face and neck.  Do not bring valuables to the hospital.  [facility] is not responsible for any belongings or valuables.  Contacts, dentures or bridgework may not be worn into surgery.  Leave your suitcase in the car.  After surgery it may be brought to your room.  For patients admitted to the hospital, discharge time will be determined by your treatment team.  Patients discharged the day of surgery will not be allowed to drive home.    Please read over the following fact sheets that you were given. Surgical Site Infection Prevention and Anesthesia Post-op Instructions     PATIENT INSTRUCTIONS POST-ANESTHESIA  IMMEDIATELY FOLLOWING SURGERY:  Do not drive or operate machinery for the first twenty four hours after surgery.  Do not make any important decisions for twenty four hours after surgery or while taking narcotic pain medications or sedatives.  If you develop intractable nausea and vomiting or a severe headache please notify your doctor immediately.  FOLLOW-UP:  Please make an appointment with your surgeon as instructed. You do not need to follow up with anesthesia unless specifically instructed to do so.  WOUND CARE INSTRUCTIONS (if applicable):  Keep a dry clean dressing on the anesthesia/puncture wound site if there is drainage.  Once the wound has quit draining you may leave it open to air.  Generally you should leave the bandage intact for twenty four hours  unless there is drainage.  If the epidural site drains for more than 36-48 hours please call the anesthesia department.  QUESTIONS?:  Please feel free to call your physician or the hospital operator if you have any questions, and they will be happy to assist you.      Laparoscopic Cholecystectomy Laparoscopic cholecystectomy is surgery to remove the gallbladder. The gallbladder is located in the upper right part of the abdomen, behind the liver. It is a storage sac for bile produced in the liver. Bile aids in the digestion and absorption of fats. Cholecystectomy is often done for inflammation of the gallbladder (cholecystitis). This condition is usually caused by a buildup of gallstones (cholelithiasis) in your gallbladder. Gallstones can block the flow of bile, resulting in inflammation and pain. In severe cases, emergency surgery may be required. When emergency surgery is not required, you will have time to prepare for the procedure. Laparoscopic surgery is an alternative to open surgery. Laparoscopic surgery has a shorter recovery time. Your common bile duct may also need to be examined during the procedure. If stones are found in the common bile duct, they may be removed. LET [facility] CARE PROVIDER KNOW ABOUT:  Any allergies you have.  All medicines you are taking, including vitamins, herbs, eye drops, creams, and over-the-counter medicines.  Previous problems you or members of your family have had with the use of anesthetics.  Any blood disorders you have.  Previous surgeries you have had.  Medical  conditions you have. RISKS AND COMPLICATIONS Generally, this is a safe procedure. However, as with any procedure, complications can occur. Possible complications include:  Infection.  Damage to the common bile duct, nerves, arteries, veins, or other internal organs such as the stomach, liver, or intestines.  Bleeding.  A stone may remain in the common bile duct.  A bile leak from  the cyst duct that is clipped when your gallbladder is removed.  The need to convert to open surgery, which requires a larger incision in the abdomen. This may be necessary if your surgeon thinks it is not safe to continue with a laparoscopic procedure. BEFORE THE PROCEDURE  Ask your health care provider about changing or stopping any regular medicines. You will need to stop taking aspirin or blood thinners at least 5 days prior to surgery.  Do not eat or drink anything after midnight the night before surgery.  Let your health care provider know if you develop a cold or other infectious problem before surgery. PROCEDURE   You will be given medicine to make you sleep through the procedure (general anesthetic). A breathing tube will be placed in your mouth.  When you are asleep, your surgeon will make several small cuts (incisions) in your abdomen.  A thin, lighted tube with a tiny camera on the end (laparoscope) is inserted through one of the small incisions. The camera on the laparoscope sends a picture to a TV screen in the operating room. This gives the surgeon a good view inside your abdomen.  A gas will be pumped into your abdomen. This expands your abdomen so that the surgeon has more room to perform the surgery.  Other tools needed for the procedure are inserted through the other incisions. The gallbladder is removed through one of the incisions.  After the removal of your gallbladder, the incisions will be closed with stitches, staples, or skin glue. AFTER THE PROCEDURE  You will be taken to a recovery area where your progress will be checked often.  You may be allowed to go home the same day if your pain is controlled and you can tolerate liquids. Document Released: 02/21/2005 Document Revised: 12/12/2012 Document Reviewed: 10/03/2012 [facility] Patient Information 2015 [place], [place]. This information is not intended to replace advice given to you by your health care provider.  Make sure you discuss any questions you have with your health care provider.

## 2014-12-04 ENCOUNTER — Encounter (HOSPITAL_COMMUNITY): Payer: Self-pay

## 2014-12-04 ENCOUNTER — Other Ambulatory Visit: Payer: Self-pay

## 2014-12-04 ENCOUNTER — Encounter (HOSPITAL_COMMUNITY)
Admission: RE | Admit: 2014-12-04 | Discharge: 2014-12-04 | Disposition: A | Discharge status: Home / Self Care | Payer: Self-pay | Source: Ambulatory Visit | Attending: General Surgery | Admitting: General Surgery

## 2014-12-04 DIAGNOSIS — Z01818 Encounter for other preprocedural examination: Secondary | ICD-10-CM | POA: Insufficient documentation

## 2014-12-04 DIAGNOSIS — K8020 Calculus of gallbladder without cholecystitis without obstruction: Secondary | ICD-10-CM | POA: Insufficient documentation

## 2014-12-04 HISTORY — DX: Adverse effect of unspecified anesthetic, initial encounter: T41.45XA

## 2014-12-04 HISTORY — DX: Anxiety disorder, unspecified: F41.9

## 2014-12-04 HISTORY — DX: Family history of other specified conditions: Z84.89

## 2014-12-04 HISTORY — DX: Other complications of anesthesia, initial encounter: T88.59XA

## 2014-12-04 LAB — BASIC METABOLIC PANEL
Anion gap: 4 — ABNORMAL LOW (ref 5–15)
BUN: 13 mg/dL (ref 6–20)
CALCIUM: 9.5 mg/dL (ref 8.9–10.3)
CO2: 29 mmol/L (ref 22–32)
CREATININE: 0.86 mg/dL (ref 0.44–1.00)
Chloride: 105 mmol/L (ref 101–111)
Glucose, Bld: 89 mg/dL (ref 65–99)
Potassium: 4.2 mmol/L (ref 3.5–5.1)
SODIUM: 138 mmol/L (ref 135–145)

## 2014-12-04 LAB — CBC WITH DIFFERENTIAL/PLATELET
BASOS ABS: 0 10*3/uL (ref 0.0–0.1)
BASOS PCT: 0 %
EOS ABS: 0.1 10*3/uL (ref 0.0–0.7)
Eosinophils Relative: 1 %
HCT: 41.6 % (ref 36.0–46.0)
HEMOGLOBIN: 14.4 g/dL (ref 12.0–15.0)
Lymphocytes Relative: 34 %
Lymphs Abs: 3.3 10*3/uL (ref 0.7–4.0)
MCH: 29.7 pg (ref 26.0–34.0)
MCHC: 34.6 g/dL (ref 30.0–36.0)
MCV: 85.8 fL (ref 78.0–100.0)
MONOS PCT: 7 %
Monocytes Absolute: 0.7 10*3/uL (ref 0.1–1.0)
NEUTROS PCT: 58 %
Neutro Abs: 5.6 10*3/uL (ref 1.7–7.7)
Platelets: 289 10*3/uL (ref 150–400)
RBC: 4.85 MIL/uL (ref 3.87–5.11)
RDW: 12 % (ref 11.5–15.5)
WBC: 9.6 10*3/uL (ref 4.0–10.5)

## 2014-12-04 LAB — HEPATIC FUNCTION PANEL
ALT: 75 U/L — ABNORMAL HIGH (ref 14–54)
AST: 44 U/L — AB (ref 15–41)
Albumin: 4.4 g/dL (ref 3.5–5.0)
Alkaline Phosphatase: 129 U/L — ABNORMAL HIGH (ref 38–126)
BILIRUBIN DIRECT: 0.1 mg/dL (ref 0.1–0.5)
BILIRUBIN INDIRECT: 0.7 mg/dL (ref 0.3–0.9)
BILIRUBIN TOTAL: 0.8 mg/dL (ref 0.3–1.2)
Total Protein: 7.5 g/dL (ref 6.5–8.1)

## 2014-12-04 LAB — HCG, SERUM, QUALITATIVE: PREG SERUM: NEGATIVE

## 2014-12-08 ENCOUNTER — Ambulatory Visit (HOSPITAL_COMMUNITY): Payer: Medicaid Other | Admitting: Anesthesiology

## 2014-12-08 ENCOUNTER — Encounter (HOSPITAL_COMMUNITY): Payer: Self-pay | Admitting: *Deleted

## 2014-12-08 ENCOUNTER — Ambulatory Visit (HOSPITAL_COMMUNITY)
Admission: RE | Admit: 2014-12-08 | Discharge: 2014-12-08 | Disposition: A | Discharge status: Home / Self Care | Payer: Medicaid Other | Source: Ambulatory Visit | Attending: General Surgery | Admitting: General Surgery

## 2014-12-08 ENCOUNTER — Encounter (HOSPITAL_COMMUNITY)
Admission: RE | Disposition: A | Discharge status: Home / Self Care | Payer: Self-pay | Source: Ambulatory Visit | Attending: General Surgery

## 2014-12-08 DIAGNOSIS — K8020 Calculus of gallbladder without cholecystitis without obstruction: Secondary | ICD-10-CM | POA: Diagnosis not present

## 2014-12-08 HISTORY — PX: CHOLECYSTECTOMY: SHX55

## 2014-12-08 SURGERY — LAPAROSCOPIC CHOLECYSTECTOMY
Anesthesia: General | Site: Abdomen

## 2014-12-08 MED ORDER — 0.9 % SODIUM CHLORIDE (POUR BTL) OPTIME
TOPICAL | Status: DC | PRN
  Administered 2014-12-08: 1000 mL

## 2014-12-08 MED ORDER — ROCURONIUM BROMIDE 100 MG/10ML IV SOLN
INTRAVENOUS | Status: DC | PRN
  Administered 2014-12-08: 25 mg via INTRAVENOUS
  Administered 2014-12-08: 5 mg via INTRAVENOUS

## 2014-12-08 MED ORDER — FENTANYL CITRATE (PF) 100 MCG/2ML IJ SOLN
INTRAMUSCULAR | Status: DC | PRN
  Administered 2014-12-08 (×7): 50 ug via INTRAVENOUS

## 2014-12-08 MED ORDER — BUPIVACAINE HCL (PF) 0.5 % IJ SOLN
INTRAMUSCULAR | Status: AC
  Filled 2014-12-08: qty 30

## 2014-12-08 MED ORDER — PROPOFOL 10 MG/ML IV BOLUS
INTRAVENOUS | Status: AC
  Filled 2014-12-08: qty 20

## 2014-12-08 MED ORDER — NEOSTIGMINE METHYLSULFATE 10 MG/10ML IV SOLN
INTRAVENOUS | Status: DC | PRN
  Administered 2014-12-08: 3 mg via INTRAVENOUS

## 2014-12-08 MED ORDER — GLYCOPYRROLATE 0.2 MG/ML IJ SOLN
INTRAMUSCULAR | Status: AC
  Filled 2014-12-08: qty 3

## 2014-12-08 MED ORDER — BUPIVACAINE HCL (PF) 0.5 % IJ SOLN
INTRAMUSCULAR | Status: DC | PRN
  Administered 2014-12-08: 10 mL

## 2014-12-08 MED ORDER — PROPOFOL 10 MG/ML IV BOLUS
INTRAVENOUS | Status: DC | PRN
  Administered 2014-12-08: 150 mg via INTRAVENOUS

## 2014-12-08 MED ORDER — FENTANYL CITRATE (PF) 100 MCG/2ML IJ SOLN
INTRAMUSCULAR | Status: AC
  Filled 2014-12-08: qty 4

## 2014-12-08 MED ORDER — LACTATED RINGERS IV SOLN
INTRAVENOUS | Status: DC
  Administered 2014-12-08: 10:00:00 via INTRAVENOUS

## 2014-12-08 MED ORDER — FENTANYL CITRATE (PF) 250 MCG/5ML IJ SOLN
INTRAMUSCULAR | Status: AC
  Filled 2014-12-08: qty 25

## 2014-12-08 MED ORDER — DEXAMETHASONE SODIUM PHOSPHATE 4 MG/ML IJ SOLN
INTRAMUSCULAR | Status: AC
Start: 2014-12-08 — End: 2014-12-08
  Filled 2014-12-08: qty 1

## 2014-12-08 MED ORDER — POVIDONE-IODINE 10 % EX OINT
TOPICAL_OINTMENT | CUTANEOUS | Status: AC
  Filled 2014-12-08: qty 1

## 2014-12-08 MED ORDER — HEMOSTATIC AGENTS (NO CHARGE) OPTIME
TOPICAL | Status: DC | PRN
  Administered 2014-12-08: 1 via TOPICAL

## 2014-12-08 MED ORDER — CHLORHEXIDINE GLUCONATE 4 % EX LIQD
1.0000 | Freq: Once | CUTANEOUS | Status: DC
Start: 2014-12-08 — End: 2014-12-08

## 2014-12-08 MED ORDER — CIPROFLOXACIN IN D5W 400 MG/200ML IV SOLN
400.0000 mg | INTRAVENOUS | Status: AC
Start: 2014-12-08 — End: 2014-12-08
  Administered 2014-12-08: 400 mg via INTRAVENOUS

## 2014-12-08 MED ORDER — OXYCODONE-ACETAMINOPHEN 7.5-325 MG PO TABS: 1.0000 | ORAL_TABLET | ORAL | Status: DC | PRN

## 2014-12-08 MED ORDER — KETOROLAC TROMETHAMINE 30 MG/ML IJ SOLN
30.0000 mg | Freq: Once | INTRAMUSCULAR | Status: AC
  Administered 2014-12-08: 30 mg via INTRAVENOUS

## 2014-12-08 MED ORDER — DEXAMETHASONE SODIUM PHOSPHATE 4 MG/ML IJ SOLN
4.0000 mg | Freq: Once | INTRAMUSCULAR | Status: AC
  Administered 2014-12-08: 4 mg via INTRAVENOUS

## 2014-12-08 MED ORDER — ONDANSETRON HCL 4 MG/2ML IJ SOLN
4.0000 mg | Freq: Once | INTRAMUSCULAR | Status: AC
  Administered 2014-12-08: 4 mg via INTRAVENOUS

## 2014-12-08 MED ORDER — NEOSTIGMINE METHYLSULFATE 10 MG/10ML IV SOLN
INTRAVENOUS | Status: AC
  Filled 2014-12-08: qty 1

## 2014-12-08 MED ORDER — FENTANYL CITRATE (PF) 100 MCG/2ML IJ SOLN
25.0000 ug | INTRAMUSCULAR | Status: DC | PRN
  Administered 2014-12-08 (×2): 50 ug via INTRAVENOUS
  Filled 2014-12-08: qty 2

## 2014-12-08 MED ORDER — ROCURONIUM BROMIDE 50 MG/5ML IV SOLN
INTRAVENOUS | Status: AC
Start: 2014-12-08 — End: 2014-12-08
  Filled 2014-12-08: qty 1

## 2014-12-08 MED ORDER — POVIDONE-IODINE 10 % OINT PACKET
TOPICAL_OINTMENT | CUTANEOUS | Status: DC | PRN
  Administered 2014-12-08: 1 via TOPICAL

## 2014-12-08 MED ORDER — CIPROFLOXACIN IN D5W 400 MG/200ML IV SOLN
INTRAVENOUS | Status: AC
  Filled 2014-12-08: qty 200

## 2014-12-08 MED ORDER — LIDOCAINE HCL (CARDIAC) 20 MG/ML IV SOLN
INTRAVENOUS | Status: DC | PRN
  Administered 2014-12-08: 40 mg via INTRAVENOUS

## 2014-12-08 MED ORDER — MIDAZOLAM HCL 2 MG/2ML IJ SOLN
1.0000 mg | INTRAMUSCULAR | Status: DC | PRN
  Administered 2014-12-08: 2 mg via INTRAVENOUS

## 2014-12-08 MED ORDER — ONDANSETRON HCL 4 MG/2ML IJ SOLN: 4.0000 mg | Freq: Once | INTRAMUSCULAR | Status: DC | PRN

## 2014-12-08 MED ORDER — GLYCOPYRROLATE 0.2 MG/ML IJ SOLN
INTRAMUSCULAR | Status: DC | PRN
  Administered 2014-12-08: .6 mg via INTRAVENOUS

## 2014-12-08 MED ORDER — LIDOCAINE HCL (PF) 1 % IJ SOLN
INTRAMUSCULAR | Status: AC
  Filled 2014-12-08: qty 5

## 2014-12-08 MED ORDER — MIDAZOLAM HCL 2 MG/2ML IJ SOLN
INTRAMUSCULAR | Status: AC
  Filled 2014-12-08: qty 2

## 2014-12-08 MED ORDER — ONDANSETRON HCL 4 MG/2ML IJ SOLN
INTRAMUSCULAR | Status: AC
  Filled 2014-12-08: qty 2

## 2014-12-08 MED ORDER — KETOROLAC TROMETHAMINE 30 MG/ML IJ SOLN
INTRAMUSCULAR | Status: AC
  Filled 2014-12-08: qty 1

## 2014-12-08 SURGICAL SUPPLY — 43 items
APPLIER CLIP LAPSCP 10X32 DD (CLIP) ×3 IMPLANT
BAG HAMPER (MISCELLANEOUS) ×3 IMPLANT
CHLORAPREP W/TINT 26ML (MISCELLANEOUS) ×3 IMPLANT
CLOTH BEACON ORANGE TIMEOUT ST (SAFETY) ×3 IMPLANT
COVER LIGHT HANDLE STERIS (MISCELLANEOUS) ×6 IMPLANT
DECANTER SPIKE VIAL GLASS SM (MISCELLANEOUS) ×3 IMPLANT
ELECT REM PT RETURN 9FT ADLT (ELECTROSURGICAL) ×3
ELECTRODE REM PT RTRN 9FT ADLT (ELECTROSURGICAL) ×1 IMPLANT
FILTER SMOKE EVAC LAPAROSHD (FILTER) ×3 IMPLANT
FORMALIN 10 PREFIL 120ML (MISCELLANEOUS) ×3 IMPLANT
GLOVE BIOGEL M 6.5 STRL (GLOVE) ×3 IMPLANT
GLOVE BIOGEL PI IND STRL 7.0 (GLOVE) ×2 IMPLANT
GLOVE BIOGEL PI INDICATOR 7.0 (GLOVE) ×4
GLOVE ECLIPSE 6.5 STRL STRAW (GLOVE) ×3 IMPLANT
GLOVE EXAM NITRILE MD LF STRL (GLOVE) ×3 IMPLANT
GLOVE SURG SS PI 7.5 STRL IVOR (GLOVE) ×3 IMPLANT
GOWN STRL REUS W/ TWL XL LVL3 (GOWN DISPOSABLE) ×1 IMPLANT
GOWN STRL REUS W/TWL LRG LVL3 (GOWN DISPOSABLE) ×6 IMPLANT
GOWN STRL REUS W/TWL XL LVL3 (GOWN DISPOSABLE) ×2
HEMOSTAT SNOW SURGICEL 2X4 (HEMOSTASIS) ×3 IMPLANT
INST SET LAPROSCOPIC AP (KITS) ×3 IMPLANT
IV NS IRRIG 3000ML ARTHROMATIC (IV SOLUTION) IMPLANT
KIT ROOM TURNOVER APOR (KITS) ×3 IMPLANT
MANIFOLD NEPTUNE II (INSTRUMENTS) ×3 IMPLANT
NEEDLE INSUFFLATION 14GA 120MM (NEEDLE) ×3 IMPLANT
NS IRRIG 1000ML POUR BTL (IV SOLUTION) ×3 IMPLANT
PACK LAP CHOLE LZT030E (CUSTOM PROCEDURE TRAY) ×3 IMPLANT
PAD ARMBOARD 7.5X6 YLW CONV (MISCELLANEOUS) ×3 IMPLANT
POUCH SPECIMEN RETRIEVAL 10MM (ENDOMECHANICALS) ×3 IMPLANT
SET BASIN LINEN APH (SET/KITS/TRAYS/PACK) ×3 IMPLANT
SET TUBE IRRIG SUCTION NO TIP (IRRIGATION / IRRIGATOR) IMPLANT
SLEEVE ENDOPATH XCEL 5M (ENDOMECHANICALS) ×3 IMPLANT
SPONGE GAUZE 2X2 8PLY STER LF (GAUZE/BANDAGES/DRESSINGS) ×1
SPONGE GAUZE 2X2 8PLY STRL LF (GAUZE/BANDAGES/DRESSINGS) ×2 IMPLANT
STAPLER VISISTAT (STAPLE) ×3 IMPLANT
SUT VICRYL 0 UR6 27IN ABS (SUTURE) ×3 IMPLANT
TAPE CLOTH SURG 4X10 WHT LF (GAUZE/BANDAGES/DRESSINGS) ×3 IMPLANT
TROCAR ENDO BLADELESS 11MM (ENDOMECHANICALS) ×3 IMPLANT
TROCAR XCEL NON-BLD 5MMX100MML (ENDOMECHANICALS) ×3 IMPLANT
TROCAR XCEL UNIV SLVE 11M 100M (ENDOMECHANICALS) ×3 IMPLANT
TUBING INSUFFLATION (TUBING) ×3 IMPLANT
WARMER LAPAROSCOPE (MISCELLANEOUS) ×3 IMPLANT
YANKAUER SUCT 12FT TUBE ARGYLE (SUCTIONS) ×3 IMPLANT

## 2014-12-08 NOTE — Anesthesia Preprocedure Evaluation (Signed)
Anesthesia Evaluation  Patient identified by MRN, date of birth, ID band Patient awake    Reviewed: Allergy & Precautions, NPO status , Patient's Chart, lab work & pertinent test results  History of Anesthesia Complications (+) Family history of anesthesia reaction and history of anesthetic complications  Airway Mallampati: II  TM Distance: >3 FB     Dental  (+) Teeth Intact, Dental Advisory Given   Pulmonary    breath sounds clear to auscultation       Cardiovascular hypertension,  Rhythm:Regular Rate:Normal     Neuro/Psych  Headaches, PSYCHIATRIC DISORDERS Anxiety    GI/Hepatic negative GI ROS,   Endo/Other    Renal/GU      Musculoskeletal   Abdominal   Peds  Hematology   Anesthesia Other Findings   Reproductive/Obstetrics                             Anesthesia Physical Anesthesia Plan  ASA: II  Anesthesia Plan: General   Post-op Pain Management:    Induction: Intravenous, Cricoid pressure planned and Rapid sequence  Airway Management Planned: Oral ETT  Additional Equipment:   Intra-op Plan:   Post-operative Plan: Extubation in OR  Informed Consent: I have reviewed the patients History and Physical, chart, labs and discussed the procedure including the risks, benefits and alternatives for the proposed anesthesia with the patient or authorized representative who has indicated his/her understanding and acceptance.     Plan Discussed with:   Anesthesia Plan Comments:         Anesthesia Quick Evaluation

## 2014-12-08 NOTE — Discharge Instructions (Signed)

## 2014-12-08 NOTE — Transfer of Care (Signed)
Immediate Anesthesia Transfer of Care Note  Patient: Jennifer Briggs  Procedure(s) Performed: Procedure(s): LAPAROSCOPIC CHOLECYSTECTOMY (N/A)  Patient Location: PACU  Anesthesia Type:General  Level of Consciousness: awake and alert   Airway & Oxygen Therapy: Patient Spontanous Breathing and Patient connected to face mask oxygen  Post-op Assessment: Report given to RN  Post vital signs: Reviewed and stable  Last Vitals:  Filed Vitals:   12/08/14 1000  BP: 121/74  Pulse:   Temp:   Resp: 14    Complications: No apparent anesthesia complications

## 2014-12-08 NOTE — Op Note (Signed)
Patient:  Jennifer Briggs  DOB:  1991/01/21  MRN:  [id number]   Preop Diagnosis:  Biliary colic, cholelithiasis  Postop Diagnosis:  Same  Procedure:  Laparoscopic cholecystectomy  Surgeon:  Franky Macho, M.D.  Anes:  Gen. endotracheal  Indications:  Patient is a 24 year old white female presents with biliary colic secondary to cholelithiasis. The risks and benefits of the procedure including bleeding, infection, hepatobiliary injury, and the possibility of an open procedure were fully explained to the patient, who gave informed consent.  Procedure note:  The patient was placed in the supine position. After induction of general endotracheal anesthesia, the abdomen was prepped and draped using the usual sterile technique with DuraPrep. Surgical site confirmation was performed.  A supraumbilical incision was made down to the fascia. A Veress needle was introduced into the abdominal cavity and confirmation of placement was done using the saline drop test. The abdomen was then insufflated to 16 mmHg pressure. An 11 mm trocar was introduced into the abdominal cavity under direct visualization without difficulty. The patient was placed in reverse Trendelenburg position and an additional 11 mm trocar was placed the epigastric region and 5 mm trochars were placed the right upper quadrant and right flank regions. Liver was inspected and noted to be within normal limits. The patient did have a single gallstone down and the infundibulum. The gallbladder was retracted with a dynamic fashion in order to expose the triangle of Calot. The cystic duct was first identified. Its juncture to the infundibulum was fully identified. Endoclips were placed proximally and distally on the cystic duct, and the cystic duct was divided. This was likewise done to the cystic artery. The gallbladder was freed away from the gallbladder fossa using Bovie electrocautery. The gallbladder was delivered through the epigastric trocar  site using an Endo Catch bag. The gallbladder fossa was inspected and no abnormal bleeding or bile leakage was noted. Surgicel was placed the gallbladder fossa. All fluid and air were then evacuated from the abdominal cavity prior to removal of the trochars.  All wounds were irrigated with normal saline. All wounds were injected with 0.5% Sensorcaine. The supraumbilical fascia was reapproximated using 0 Vicryl running suture. All skin incisions were closed using staples. Betadine ointment and dry sterile dressings were applied.  All tape and needle counts were correct at the end of the procedure. Patient was extubated in the operating room and transferred to PACU in stable condition.  Complications:  None  EBL:  Minimal  Specimen:  Gallbladder

## 2014-12-08 NOTE — Anesthesia Procedure Notes (Signed)
Procedure Name: Intubation Date/Time: 12/08/2014 10:25 AM Performed by: Glynn Octave E Pre-anesthesia Checklist: Patient identified, Patient being monitored, Timeout performed, Emergency Drugs available and Suction available Patient Re-evaluated:Patient Re-evaluated prior to inductionOxygen Delivery Method: Circle System Utilized Preoxygenation: Pre-oxygenation with 100% oxygen Intubation Type: IV induction Ventilation: Mask ventilation without difficulty Laryngoscope Size: Mac and 3 Grade View: Grade II Tube type: Oral Tube size: 7.0 mm Number of attempts: 1 Airway Equipment and Method: stylet Placement Confirmation: ETT inserted through vocal cords under direct vision,  positive ETCO2 and breath sounds checked- equal and bilateral Secured at: 22 cm Tube secured with: Tape Dental Injury: Teeth and Oropharynx as per pre-operative assessment

## 2014-12-08 NOTE — Anesthesia Postprocedure Evaluation (Signed)
  Anesthesia Post-op Note  Patient: Jennifer Briggs  Procedure(s) Performed: Procedure(s): LAPAROSCOPIC CHOLECYSTECTOMY (N/A)  Patient Location: PACU  Anesthesia Type:General  Level of Consciousness: awake, alert  and oriented  Airway and Oxygen Therapy: Patient Spontanous Breathing  Post-op Pain: mild  Post-op Assessment: Post-op Vital signs reviewed, Patient's Cardiovascular Status Stable, Respiratory Function Stable, Patent Airway and No signs of Nausea or vomiting              Post-op Vital Signs: Reviewed and stable  Last Vitals:  Filed Vitals:   12/08/14 1202  BP: 126/74  Pulse: 50  Temp: 37 C  Resp: 16    Complications: No apparent anesthesia complications

## 2014-12-08 NOTE — Interval H&P Note (Signed)
History and Physical Interval Note:  12/08/2014 9:41 AM  Jennifer Briggs  has presented today for surgery, with the diagnosis of cholelithiasis  The various methods of treatment have been discussed with the patient and family. After consideration of risks, benefits and other options for treatment, the patient has consented to  Procedure(s): LAPAROSCOPIC CHOLECYSTECTOMY (N/A) as a surgical intervention .  The patient's history has been reviewed, patient examined, no change in status, stable for surgery.  I have reviewed the patient's chart and labs.  Questions were answered to the patient's satisfaction.     Franky Macho A

## 2014-12-09 ENCOUNTER — Encounter (HOSPITAL_COMMUNITY): Payer: Self-pay | Admitting: General Surgery

## 2015-12-14 ENCOUNTER — Emergency Department (HOSPITAL_COMMUNITY)
Admission: EM | Admit: 2015-12-14 | Discharge: 2015-12-14 | Disposition: A | Discharge status: Home / Self Care | Payer: Medicaid Other | Attending: Emergency Medicine | Admitting: Emergency Medicine

## 2015-12-14 ENCOUNTER — Encounter (HOSPITAL_COMMUNITY): Payer: Self-pay

## 2015-12-14 DIAGNOSIS — I10 Essential (primary) hypertension: Secondary | ICD-10-CM | POA: Insufficient documentation

## 2015-12-14 DIAGNOSIS — J020 Streptococcal pharyngitis: Secondary | ICD-10-CM | POA: Insufficient documentation

## 2015-12-14 MED ORDER — AMOXICILLIN 500 MG PO CAPS: 500.0000 mg | ORAL_CAPSULE | Freq: Three times a day (TID) | ORAL | 0 refills | Status: AC

## 2015-12-14 NOTE — ED Provider Notes (Signed)
[facility]-EMERGENCY DEPT Provider Note   CSN: [id number] Arrival date & time: 12/14/15  1647  By signing my name below, I, Placido Sou, attest that this documentation has been prepared under the direction and in the presence of Burgess Amor, PA-C. Electronically Signed: Placido Sou, ED Scribe. 12/14/15. 7:21 PM.   History   Chief Complaint Chief Complaint  Patient presents with  . Sore Throat    HPI HPI Comments: Jennifer Briggs is a 25 y.o. female with a h/o tonsillectomy who presents to the Emergency Department complaining of constant, mild, sore throat x 2 days. She states she woke with her symptoms with associated post nasal drip, mild diffuse neck pain, body aches, mild HA, mild ear pain, mild cough, subjective fevers, chills, sinus congestion, and worsening throat pain when swallowing. Pt took OTC Cold & Flu medication which provides mild short term relief and also reports taking 800 mg ibuprofen earlier today which provides mild relief of her neck pain. 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 Pt denies taking any regular medications. She confirms her listed allergies. Pt denies any possibility of being pregnant. She denies SOB or other associated symptoms at this time.   The history is provided by the patient. No language interpreter was used.    Past Medical History:  Diagnosis Date  . Anxiety   . Complication of anesthesia    difficuly waking up from anesthesia from dental surgery  . Family history of adverse reaction to anesthesia    mother had difficulty waking up from anesthesia  . Hypertension   . Migraines     There are no active problems to display for this patient.   Past Surgical History:  Procedure Laterality Date  . ADENOIDECTOMY    . CHOLECYSTECTOMY N/A 12/08/2014   Procedure: LAPAROSCOPIC CHOLECYSTECTOMY;  Surgeon: Franky Macho, MD;  Location: [facility] ORS;  Service: General;  Laterality: N/A;  .  TONSILLECTOMY    . TOOTH EXTRACTION Left 2013    OB History    No data available       Home Medications    Prior to Admission medications   Medication Sig Start Date End Date Taking? Authorizing Provider  amoxicillin (AMOXIL) 500 MG capsule Take 1 capsule (500 mg total) by mouth 3 (three) times daily. 12/14/15 12/24/15  Burgess Amor, PA-C  oxyCODONE-acetaminophen (PERCOCET) 7.5-325 MG tablet Take 1-2 tablets by mouth every 4 (four) hours as needed. 12/08/14   Franky Macho, MD    Family History No family history on file.  Social History Social History  Substance Use Topics  . Smoking status: Never Smoker  . Smokeless tobacco: Never Used  . Alcohol use No   Allergies   Morphine and related   Review of Systems Review of Systems  Constitutional: Positive for chills and fever.  HENT: Positive for congestion, ear pain, postnasal drip and sore throat.   Respiratory: Positive for cough. Negative for shortness of breath.   Musculoskeletal: Positive for myalgias and neck pain.  Neurological: Positive for headaches.   Physical Exam Updated Vital Signs BP 112/70 (BP Location: Left Arm)   Pulse 64   Temp 98.5 F (36.9 C) (Oral)   Resp 18   Ht 5\' 6"  (1.676 m)   Wt 117.9 kg   LMP 12/09/2015   SpO2 96%   BMI 41.97 kg/m   Physical Exam  Constitutional: She is oriented to person, place, and time. She appears well-developed and  well-nourished.  HENT:  Head: Normocephalic and atraumatic.  Mouth/Throat: Posterior oropharyngeal erythema present.  Bilateral tonsillar remnants which are beefy red w/o exudate. Petechiae on soft palate.   Eyes: EOM are normal.  Neck: Normal range of motion.  Cardiovascular: Normal rate, regular rhythm and normal heart sounds.  Exam reveals no gallop and no friction rub.   No murmur heard. Pulmonary/Chest: Effort normal. No respiratory distress. She has no wheezes. She has no rales.  Musculoskeletal: Normal range of motion.  Neurological: She is  alert and oriented to person, place, and time.  Skin: Skin is warm and dry.  Psychiatric: She has a normal mood and affect.  Nursing note and vitals reviewed.  ED Treatments / Results  Labs (all labs ordered are listed, but only abnormal results are displayed) Labs Reviewed - No data to display  EKG  EKG Interpretation None       Radiology No results found.  Procedures Procedures  DIAGNOSTIC STUDIES: Oxygen Saturation is 96% on RA, normal by my interpretation.    COORDINATION OF CARE: 7:20 PM Discussed next steps with pt. Pt verbalized understanding and is agreeable with the plan.    Medications Ordered in ED Medications - No data to display   Initial Impression / Assessment and Plan / ED Course  I have reviewed the triage vital signs and the nursing notes.  Pertinent labs & imaging results that were available during my care of the patient were reviewed by me and considered in my medical decision making (see chart for details).  Clinical Course  No trismus, no exam findings suggesting peritonsillar abscess. Pt maintaining PO intake. Given exam findings and recent strep exposure, pt tx for strep pharyngitis. Amoxil, ibuprofen, rest.  F/u for recheck for any worsened or persistent sx.  Work note provided.  I personally performed the services described in this documentation, which was scribed in my presence. The recorded information has been reviewed and is accurate.  Final Clinical Impressions(s) / ED Diagnoses   Final diagnoses:  Strep throat    New Prescriptions Discharge Medication List as of 12/14/2015  7:38 PM    START taking these medications   Details  amoxicillin (AMOXIL) 500 MG capsule Take 1 capsule (500 mg total) by mouth 3 (three) times daily., Starting Mon 12/14/2015, Until Thu 12/24/2015, Print         Burgess AmorJulie Cydnie Deason, PA-C 12/16/15 1320    Vanetta MuldersScott Zackowski, MD 12/18/15 [phone]

## 2015-12-14 NOTE — ED Triage Notes (Signed)
Pt c/o sore throat, body aches, and headache since Saturday.

## 2015-12-14 NOTE — Discharge Instructions (Signed)
Rest and make sure you are drinking plenty of fluids.  You may take ibuprofen as you have done 800 mg every 8 hours for throat pain relief. Take the entire course of the antibiotic.

## 2016-11-23 ENCOUNTER — Emergency Department (HOSPITAL_COMMUNITY): Payer: Self-pay

## 2016-11-23 ENCOUNTER — Emergency Department (HOSPITAL_COMMUNITY)
Admission: EM | Admit: 2016-11-23 | Discharge: 2016-11-23 | Disposition: A | Discharge status: Home / Self Care | Payer: Self-pay | Attending: Emergency Medicine | Admitting: Emergency Medicine

## 2016-11-23 ENCOUNTER — Encounter (HOSPITAL_COMMUNITY): Payer: Self-pay | Admitting: *Deleted

## 2016-11-23 DIAGNOSIS — I10 Essential (primary) hypertension: Secondary | ICD-10-CM | POA: Insufficient documentation

## 2016-11-23 DIAGNOSIS — J0100 Acute maxillary sinusitis, unspecified: Secondary | ICD-10-CM

## 2016-11-23 MED ORDER — CETIRIZINE-PSEUDOEPHEDRINE ER 5-120 MG PO TB12: 1.0000 | ORAL_TABLET | Freq: Two times a day (BID) | ORAL | 0 refills | Status: DC

## 2016-11-23 MED ORDER — AMOXICILLIN 500 MG PO CAPS: 500.0000 mg | ORAL_CAPSULE | Freq: Three times a day (TID) | ORAL | 0 refills | Status: AC

## 2016-11-23 NOTE — ED Triage Notes (Signed)
Pt productive cough x 1 month, nasal congestion, sweating and nausea since Sunday. Denies vomiting.

## 2016-11-23 NOTE — Discharge Instructions (Signed)
Take the entire course of the antibiotic.  I also suggest using a decongestant which has been prescribed for you.  Bathroom steam/ moisture can help with nasal and sinus congestion, warm compresses to the face, menthol cough drops can sometimes relieve congestion as well.

## 2016-11-25 NOTE — ED Provider Notes (Signed)
[facility] Provider Note   CSN: [id number] Arrival date & time: 11/23/16  1101     History   Chief Complaint Chief Complaint  Patient presents with  . Cough    HPI Jennifer Briggs is a 26 y.o. female presenting with a 1 month history of a nonproductive cough and nasal congestion with clear rhinorrhea and an itchy nonpainful throat.  Over the past week her nasal symptoms have worsened to include cheek pressure radiating into her ears, thick green and blood tinged nasal discharge and subjective fever.  She endorses mild nausea. She has been taking loratadine without relief.  The history is provided by the patient.    Past Medical History:  Diagnosis Date  . Anxiety   . Complication of anesthesia    difficuly waking up from anesthesia from dental surgery  . Family history of adverse reaction to anesthesia    mother had difficulty waking up from anesthesia  . Hypertension   . Migraines     There are no active problems to display for this patient.   Past Surgical History:  Procedure Laterality Date  . ADENOIDECTOMY    . CHOLECYSTECTOMY N/A 12/08/2014   Procedure: LAPAROSCOPIC CHOLECYSTECTOMY;  Surgeon: Franky Macho, MD;  Location: [facility];  Service: General;  Laterality: N/A;  . TONSILLECTOMY    . TOOTH EXTRACTION Left 2013    OB History    No data available       Home Medications    Prior to Admission medications   Medication Sig Start Date End Date Taking? Authorizing Provider  amoxicillin (AMOXIL) 500 MG capsule Take 1 capsule (500 mg total) by mouth 3 (three) times daily. 11/23/16 12/03/16  Burgess Amor, PA-C  cetirizine-pseudoephedrine (ZYRTEC-D) 5-120 MG tablet Take 1 tablet by mouth 2 (two) times daily. 11/23/16   Burgess Amor, PA-C  oxyCODONE-acetaminophen (PERCOCET) 7.5-325 MG tablet Take 1-2 tablets by mouth every 4 (four) hours as needed. 12/08/14   Franky Macho, MD    Family History No family history on file.  Social History Social History    Substance Use Topics  . Smoking status: Never Smoker  . Smokeless tobacco: Never Used  . Alcohol use No     Allergies   Morphine and related   Review of Systems Review of Systems  Constitutional: Positive for fever. Negative for chills.  HENT: Positive for congestion, postnasal drip, rhinorrhea, sinus pressure and sore throat. Negative for ear pain, trouble swallowing and voice change.   Eyes: Negative for discharge.  Respiratory: Positive for cough. Negative for shortness of breath, wheezing and stridor.   Cardiovascular: Negative for chest pain.  Gastrointestinal: Negative for abdominal pain.  Genitourinary: Negative.      Physical Exam Updated Vital Signs BP 124/61   Pulse 69   Temp 98.3 F (36.8 C) (Oral)   Resp 18   Ht  (1.702 m)   Wt 113.4 kg (250 lb)   LMP 11/13/2016   SpO2 99%   BMI 39.16 kg/m   Physical Exam  Constitutional: She is oriented to person, place, and time. She appears well-developed and well-nourished.  HENT:  Head: Normocephalic and atraumatic.  Right Ear: Tympanic membrane and ear canal normal.  Left Ear: Tympanic membrane and ear canal normal.  Nose: Mucosal edema and rhinorrhea present. Right sinus exhibits maxillary sinus tenderness. Left sinus exhibits maxillary sinus tenderness.  Mouth/Throat: Uvula is midline, oropharynx is clear and moist and mucous membranes are normal. No oropharyngeal exudate, posterior oropharyngeal edema, posterior oropharyngeal  erythema or tonsillar abscesses.  Eyes: Conjunctivae are normal.  Cardiovascular: Normal rate and normal heart sounds.   Pulmonary/Chest: Effort normal. No respiratory distress. She has no wheezes. She has no rales.  Abdominal: Soft. There is no tenderness.  Musculoskeletal: Normal range of motion.  Neurological: She is alert and oriented to person, place, and time.  Skin: Skin is warm and dry. No rash noted.  Psychiatric: She has a normal mood and affect.     ED Treatments /  Results  Labs (all labs ordered are listed, but only abnormal results are displayed) Labs Reviewed - No data to display  EKG  EKG Interpretation None       Radiology Dg Chest 2 View  Result Date: 11/23/2016 CLINICAL DATA:  Productive cough. EXAM: CHEST  2 VIEW COMPARISON:  04/02/2013 FINDINGS: Stable asymmetric elevation right hemidiaphragm. The lungs are clear without focal pneumonia, edema, pneumothorax or pleural effusion. The cardiopericardial silhouette is within normal limits for size. The visualized bony structures of the thorax are intact. IMPRESSION: No active cardiopulmonary disease. Electronically Signed   By: Kennith Center M.D.   On: 11/23/2016 12:19    Procedures Procedures (including critical care time)  Medications Ordered in ED Medications - No data to display   Initial Impression / Assessment and Plan / ED Course  I have reviewed the triage vital signs and the nursing notes.  Pertinent labs & imaging results that were available during my care of the patient were reviewed by me and considered in my medical decision making (see chart for details).     Probable chronic allergic rhinitis with resulting acute sinusitis.  Pt was started on amoxil, switch to zyrtec d for decongestant purposes in place of loratadine. Discussed other tx for congestion.  Discussed return precautions.  Final Clinical Impressions(s) / ED Diagnoses   Final diagnoses:  Acute non-recurrent maxillary sinusitis    New Prescriptions Discharge Medication List as of 11/23/2016 12:44 PM    START taking these medications   Details  amoxicillin (AMOXIL) 500 MG capsule Take 1 capsule (500 mg total) by mouth 3 (three) times daily., Starting Wed 11/23/2016, Until Sat 12/03/2016, Print    cetirizine-pseudoephedrine (ZYRTEC-D) 5-120 MG tablet Take 1 tablet by mouth 2 (two) times daily., Starting Wed 11/23/2016, Print         Burgess Amor, PA-C 11/25/16 1141    Jacalyn Lefevre, MD 11/25/16  [phone]

## 2017-03-01 DIAGNOSIS — J209 Acute bronchitis, unspecified: Secondary | ICD-10-CM | POA: Insufficient documentation

## 2017-03-01 DIAGNOSIS — Z79899 Other long term (current) drug therapy: Secondary | ICD-10-CM | POA: Insufficient documentation

## 2017-03-02 ENCOUNTER — Emergency Department (HOSPITAL_COMMUNITY)
Admission: EM | Admit: 2017-03-02 | Discharge: 2017-03-02 | Disposition: A | Discharge status: Home / Self Care | Payer: Self-pay | Attending: Emergency Medicine | Admitting: Emergency Medicine

## 2017-03-02 ENCOUNTER — Emergency Department (HOSPITAL_COMMUNITY): Payer: Self-pay

## 2017-03-02 ENCOUNTER — Encounter (HOSPITAL_COMMUNITY): Payer: Self-pay | Admitting: Emergency Medicine

## 2017-03-02 DIAGNOSIS — J209 Acute bronchitis, unspecified: Secondary | ICD-10-CM

## 2017-03-02 MED ORDER — PREDNISONE 10 MG PO TABS
60.0000 mg | ORAL_TABLET | Freq: Once | ORAL | Status: AC
  Administered 2017-03-02: 60 mg via ORAL
  Filled 2017-03-02: qty 1

## 2017-03-02 MED ORDER — IPRATROPIUM-ALBUTEROL 0.5-2.5 (3) MG/3ML IN SOLN
3.0000 mL | Freq: Once | RESPIRATORY_TRACT | Status: AC
  Administered 2017-03-02: 3 mL via RESPIRATORY_TRACT
  Filled 2017-03-02: qty 3

## 2017-03-02 MED ORDER — ALBUTEROL SULFATE HFA 108 (90 BASE) MCG/ACT IN AERS
2.0000 | INHALATION_SPRAY | RESPIRATORY_TRACT | Status: DC | PRN
  Administered 2017-03-02: 2 via RESPIRATORY_TRACT
  Filled 2017-03-02 (×2): qty 6.7

## 2017-03-02 MED ORDER — PREDNISONE 20 MG PO TABS: 60.0000 mg | ORAL_TABLET | Freq: Every day | ORAL | 0 refills | Status: DC

## 2017-03-02 NOTE — ED Notes (Signed)
Pt ambulatory to waiting room. Pt verbalized understanding of discharge instructions.   

## 2017-03-02 NOTE — ED Triage Notes (Signed)
Pt states she has been sick for a week with a cough, and runny nose.

## 2017-03-02 NOTE — ED Provider Notes (Signed)
[facility] EMERGENCY DEPARTMENT Provider Note   CSN: [id number] Arrival date & time: 03/01/17  2350     History   Chief Complaint Chief Complaint  Patient presents with  . Cough    HPI Jennifer Briggs is a 26 y.o. female.  The history is provided by the patient.  She has history of hypertension and migraines, and comes in with one-week of rhinorrhea and cough.  Cough is productive of green sputum.  She denies fever or chills or sweats.  She denies sore throat.  She denies arthralgias or myalgias.  There have been no known sick contacts.  She is a non-smoker.  She has not done anything to treat her current symptoms.  Past Medical History:  Diagnosis Date  . Anxiety   . Complication of anesthesia    difficuly waking up from anesthesia from dental surgery  . Family history of adverse reaction to anesthesia    mother had difficulty waking up from anesthesia  . Hypertension   . Migraines     There are no active problems to display for this patient.   Past Surgical History:  Procedure Laterality Date  . ADENOIDECTOMY    . CHOLECYSTECTOMY N/A 12/08/2014   Procedure: LAPAROSCOPIC CHOLECYSTECTOMY;  Surgeon: Franky MachoMark Jenkins, MD;  Location: [facility];  Service: General;  Laterality: N/A;  . TONSILLECTOMY    . TOOTH EXTRACTION Left 2013    OB History    No data available       Home Medications    Prior to Admission medications   Medication Sig Start Date End Date Taking? Authorizing Provider  cetirizine-pseudoephedrine (ZYRTEC-D) 5-120 MG tablet Take 1 tablet by mouth 2 (two) times daily. 11/23/16   Burgess AmorIdol, Julie, PA-C  oxyCODONE-acetaminophen (PERCOCET) 7.5-325 MG tablet Take 1-2 tablets by mouth every 4 (four) hours as needed. 12/08/14   Franky MachoJenkins, Mark, MD    Family History No family history on file.  Social History Social History   Tobacco Use  . Smoking status: Never Smoker  . Smokeless tobacco: Never Used  Substance Use Topics  . Alcohol use: No  . Drug use: No       Allergies   Morphine and related   Review of Systems Review of Systems  All other systems reviewed and are negative.    Physical Exam Updated Vital Signs BP 121/75 (BP Location: Left Arm)   Pulse 95   Temp 98 F (36.7 C) (Oral)   Resp 15   Ht 5\' 6"  (1.676 m)   Wt 117.9 kg (260 lb)   LMP  (LMP Unknown)   SpO2 99%   BMI 41.97 kg/m   Physical Exam  Nursing note and vitals reviewed.  26 year old female, resting comfortably and in no acute distress. Vital signs are normal. Oxygen saturation is 99%, which is normal. Head is normocephalic and atraumatic. PERRLA, EOMI. Oropharynx is clear.  There is no sinus tenderness. Neck is nontender and supple without adenopathy or JVD. Back is nontender and there is no CVA tenderness. Lungs have scattered coarse expiratory wheezes without rales or rhonchi. Chest is nontender. Heart has regular rate and rhythm without murmur. Abdomen is soft, flat, nontender without masses or hepatosplenomegaly and peristalsis is normoactive. Extremities have no cyanosis or edema, full range of motion is present. Skin is warm and dry without rash. Neurologic: Mental status is normal, cranial nerves are intact, there are no motor or sensory deficits.  ED Treatments / Results   Radiology Dg Chest 2 View  Result Date: 03/02/2017 CLINICAL DATA:  26 year old female with cough. EXAM: CHEST  2 VIEW COMPARISON:  Chest radiograph dated 11/23/2016 FINDINGS: The heart size and mediastinal contours are within normal limits. Both lungs are clear. The visualized skeletal structures are unremarkable. IMPRESSION: No active cardiopulmonary disease. Electronically Signed   By: Elgie CollardArash  Radparvar M.D.   On: 03/02/2017 01:24    Procedures Procedures (including critical care time)  Medications Ordered in ED Medications  albuterol (PROVENTIL HFA;VENTOLIN HFA) 108 (90 Base) MCG/ACT inhaler 2 puff (not administered)  predniSONE (DELTASONE) tablet 60 mg (not  administered)  ipratropium-albuterol (DUONEB) 0.5-2.5 (3) MG/3ML nebulizer solution 3 mL (3 mLs Nebulization Given 03/02/17 0057)     Initial Impression / Assessment and Plan / ED Course  I have reviewed the triage vital signs and the nursing notes.  Pertinent labs & imaging results that were available during my care of the patient were reviewed by me and considered in my medical decision making (see chart for details).  Respiratory tract infection with rhinorrhea and cough-possible influenza.  No indication for influenza testing since she is beyond the threshold for instituting antiviral treatment.  Will check chest x-ray to rule out pneumonia.  Because of evidence of bronchospasm on exam, will give a trial of albuterol with ipratropium.  Old records are reviewed, and she has no relevant past visits.  X-ray shows no evidence of pneumonia.  She got excellent relief with nebulizer treatment.  She is given a dose of prednisone and discharged with prescription for prednisone.  She is given an albuterol inhaler to take home.  Return precautions discussed.  Given work release for 24 hours.  Final Clinical Impressions(s) / ED Diagnoses   Final diagnoses:  Acute bronchitis, unspecified organism    ED Discharge Orders        Ordered    predniSONE (DELTASONE) 20 MG tablet  Daily     03/02/17 0200       Dione BoozeGlick, Anthonymichael Munday, MD 03/02/17 0202

## 2017-06-27 ENCOUNTER — Other Ambulatory Visit: Payer: Self-pay

## 2017-06-27 ENCOUNTER — Encounter (HOSPITAL_COMMUNITY): Payer: Self-pay | Admitting: *Deleted

## 2017-06-27 ENCOUNTER — Emergency Department (HOSPITAL_COMMUNITY)
Admission: EM | Admit: 2017-06-27 | Discharge: 2017-06-28 | Disposition: A | Discharge status: Home / Self Care | Payer: Self-pay | Attending: Emergency Medicine | Admitting: Emergency Medicine

## 2017-06-27 DIAGNOSIS — Z9049 Acquired absence of other specified parts of digestive tract: Secondary | ICD-10-CM | POA: Insufficient documentation

## 2017-06-27 DIAGNOSIS — J189 Pneumonia, unspecified organism: Secondary | ICD-10-CM | POA: Insufficient documentation

## 2017-06-27 DIAGNOSIS — I10 Essential (primary) hypertension: Secondary | ICD-10-CM | POA: Insufficient documentation

## 2017-06-27 DIAGNOSIS — F419 Anxiety disorder, unspecified: Secondary | ICD-10-CM | POA: Insufficient documentation

## 2017-06-27 DIAGNOSIS — Z79899 Other long term (current) drug therapy: Secondary | ICD-10-CM | POA: Insufficient documentation

## 2017-06-27 NOTE — ED Triage Notes (Signed)
Pt reports headache, fever, cough, runny nose and chills since Sunday.

## 2017-06-27 NOTE — ED Notes (Signed)
Pt and family made aware of wait time.

## 2017-06-28 MED ORDER — AZITHROMYCIN 250 MG PO TABS
500.0000 mg | ORAL_TABLET | Freq: Once | ORAL | Status: AC
  Administered 2017-06-28: 500 mg via ORAL
  Filled 2017-06-28: qty 2

## 2017-06-28 MED ORDER — AMOXICILLIN 250 MG PO CAPS
500.0000 mg | ORAL_CAPSULE | Freq: Three times a day (TID) | ORAL | Status: DC
  Administered 2017-06-28: 500 mg via ORAL
  Filled 2017-06-28: qty 2

## 2017-06-28 MED ORDER — AZITHROMYCIN 250 MG PO TABS: ORAL_TABLET | ORAL | 0 refills | Status: DC

## 2017-06-28 MED ORDER — ALBUTEROL SULFATE HFA 108 (90 BASE) MCG/ACT IN AERS: 2.0000 | INHALATION_SPRAY | RESPIRATORY_TRACT | 0 refills | Status: AC | PRN

## 2017-06-28 MED ORDER — AMOXICILLIN 500 MG PO CAPS: 500.0000 mg | ORAL_CAPSULE | Freq: Three times a day (TID) | ORAL | 0 refills | Status: DC

## 2017-06-28 NOTE — ED Provider Notes (Signed)
[facility] EMERGENCY DEPARTMENT Provider Note   CSN: [id number] Arrival date & time: 06/27/17  2113  Time seen 23:33 PM   History   Chief Complaint Chief Complaint  Patient presents with  . Headache    HPI Jennifer Briggs is a 27 y.o. female.  HPI patient states April 21 she started having cough with yellow-green mucus production that is now white, fever up to 101.2 yesterday evening, chills today about 8 PM.  She has had rhinorrhea that was initially green and yellow and is now clear, with sneezing.  She only has a sore throat while she coughs.  She has had mild nausea without vomiting and states she had diarrhea the first day and then 1-2 episodes yesterday.  She states she feels short of breath when she coughs and her chest feels tight and she feels like she is had wheezing however those symptoms improve when she uses her inhaler.  She denies history of reactive airway disease and does not smoke however she states when she gets respiratory infections she uses inhalers.  Please note patient's husband is in the ED with similar symptoms and he does have pneumonia, he gave me permission to talk to the patient about his diagnosis.  [facility]   Past Medical History:  Diagnosis Date  . Anxiety   . Complication of anesthesia    difficuly waking up from anesthesia from dental surgery  . Family history of adverse reaction to anesthesia    mother had difficulty waking up from anesthesia  . Hypertension   . Migraines     There are no active problems to display for this patient.   Past Surgical History:  Procedure Laterality Date  . ADENOIDECTOMY    . CHOLECYSTECTOMY N/A 12/08/2014   Procedure: LAPAROSCOPIC CHOLECYSTECTOMY;  Surgeon: Franky Macho, MD;  Location: [facility];  Service: General;  Laterality: N/A;  . TONSILLECTOMY    . TOOTH EXTRACTION Left 2013     OB History   None      Home Medications    Prior to Admission medications     Medication Sig Start Date End Date Taking? Authorizing Provider  albuterol (PROVENTIL HFA;VENTOLIN HFA) 108 (90 Base) MCG/ACT inhaler Inhale 1-2 puffs into the lungs every 6 (six) hours as needed for wheezing or shortness of breath.   Yes [provider]  [employer] MELATONIN PO Take 2 each by mouth at bedtime.   Yes [provider]  Cyanocobalamin (B-12 PO) Take 1 tablet by mouth daily.   Yes [provider]  ibuprofen (ADVIL,MOTRIN) 200 MG tablet Take 600-800 mg by mouth every 6 (six) hours as needed for mild pain or moderate pain.   Yes [provider]  phenylephrine (SUDAFED PE) 10 MG TABS tablet Take 10 mg by mouth every 4 (four) hours as needed (for sinus congestion).   Yes [provider]  albuterol (PROVENTIL HFA;VENTOLIN HFA) 108 (90 Base) MCG/ACT inhaler Inhale 2 puffs into the lungs every 4 (four) hours as needed. 06/28/17   Devoria Albe, MD  amoxicillin (AMOXIL) 500 MG capsule Take 1 capsule (500 mg total) by mouth 3 (three) times daily. 06/28/17   Devoria Albe, MD  azithromycin (ZITHROMAX) 250 MG tablet Take 1 po QD x 4 days starting the evening of April 24 06/28/17   Devoria Albe, MD    Family History No family history on file.  Social History Social History   Tobacco Use  . Smoking status: Never Smoker  .  Smokeless tobacco: Never Used  Substance Use Topics  . Alcohol use: No  . Drug use: No  employed Lives with spouse   Allergies   Morphine and related   Review of Systems Review of Systems  All other systems reviewed and are negative.    Physical Exam Updated Vital Signs BP 133/83 (BP Location: Right Arm)   Pulse (!) 115   Temp 99.5 F (37.5 C) (Oral)   Resp 19   Ht 5\' 6"  (1.676 m)   Wt 117.9 kg (260 lb)   LMP 06/27/2017   SpO2 99%   BMI 41.97 kg/m   Vital signs normal except low-grade temperature and tachycardia   Physical Exam  Constitutional: She is oriented to person, place, and time. She appears well-developed  and well-nourished.  Non-toxic appearance. She does not appear ill. No distress.  HENT:  Head: Normocephalic and atraumatic.  Right Ear: External ear normal.  Left Ear: External ear normal.  Nose: Nose normal. No mucosal edema or rhinorrhea.  Mouth/Throat: Oropharynx is clear and moist and mucous membranes are normal. No dental abscesses or uvula swelling.  Eyes: Pupils are equal, round, and reactive to light. Conjunctivae and EOM are normal.  Neck: Normal range of motion and full passive range of motion without pain. Neck supple.  Cardiovascular: Normal rate, regular rhythm and normal heart sounds. Exam reveals no gallop and no friction rub.  No murmur heard. Pulmonary/Chest: Effort normal and breath sounds normal. No respiratory distress. She has no wheezes. She has no rhonchi. She has no rales. She exhibits no tenderness and no crepitus.  When patient takes a big deep breath she coughs, however she has no restriction in her airflow, wheezing or rhonchi.  Abdominal: Soft. Normal appearance and bowel sounds are normal. She exhibits no distension. There is no tenderness. There is no rebound and no guarding.  Musculoskeletal: Normal range of motion. She exhibits no edema or tenderness.  Moves all extremities well.   Neurological: She is alert and oriented to person, place, and time. She has normal strength. No cranial nerve deficit.  Skin: Skin is warm, dry and intact. No rash noted. No erythema. No pallor.  Psychiatric: She has a normal mood and affect. Her speech is normal and behavior is normal. Her mood appears not anxious.  Nursing note and vitals reviewed.    ED Treatments / Results  Labs (all labs ordered are listed, but only abnormal results are displayed) Labs Reviewed - No data to display  EKG None  Radiology No results found.  Procedures Procedures (including critical care time)  Medications Ordered in ED Medications  amoxicillin (AMOXIL) capsule 500 mg (has no  administration in time range)  azithromycin (ZITHROMAX) tablet 500 mg (has no administration in time range)     Initial Impression / Assessment and Plan / ED Course  I have reviewed the triage vital signs and the nursing notes.  Pertinent labs & imaging results that were available during my care of the patient were reviewed by me and considered in my medical decision making (see chart for details).     Patient is in the emergency department with her husband who is also a patient.  He has pneumonia.  I am going to go ahead and treat the patient also.  She was given a prescription for another inhaler when hers runs out, and she was treated with outpatient oral antibiotics.  Final Clinical Impressions(s) / ED Diagnoses   Final diagnoses:  Community acquired pneumonia, unspecified laterality  ED Discharge Orders        Ordered    azithromycin (ZITHROMAX) 250 MG tablet     06/28/17 0022    amoxicillin (AMOXIL) 500 MG capsule  3 times daily     06/28/17 0022    albuterol (PROVENTIL HFA;VENTOLIN HFA) 108 (90 Base) MCG/ACT inhaler  Every 4 hours PRN     06/28/17 0022      Plan discharge  Devoria Albe, MD, Concha Pyo, MD 06/28/17 [phone]

## 2017-06-28 NOTE — Discharge Instructions (Addendum)
Drink plenty of fluids. Take ibuprofen 600 mg and/or acetaminophen 1000 mg every 6 hrs for fever or body aches. Take the antibiotics until gone. Take mucinex DM OTC for cough. Use your inhaler if you get wheezing or shortness of breath. Return to the ED if you struggle to breathe, have vomiting and cannot take your medications or you feel you are getting worse instead of better.

## 2018-01-18 IMAGING — DX DG CHEST 2V
2 series · 2 of 2 positions shown · non-contrast
Comparison: 04/02/2013

CLINICAL DATA: Productive cough.

EXAM:
CHEST  2 VIEW

[chest pa]
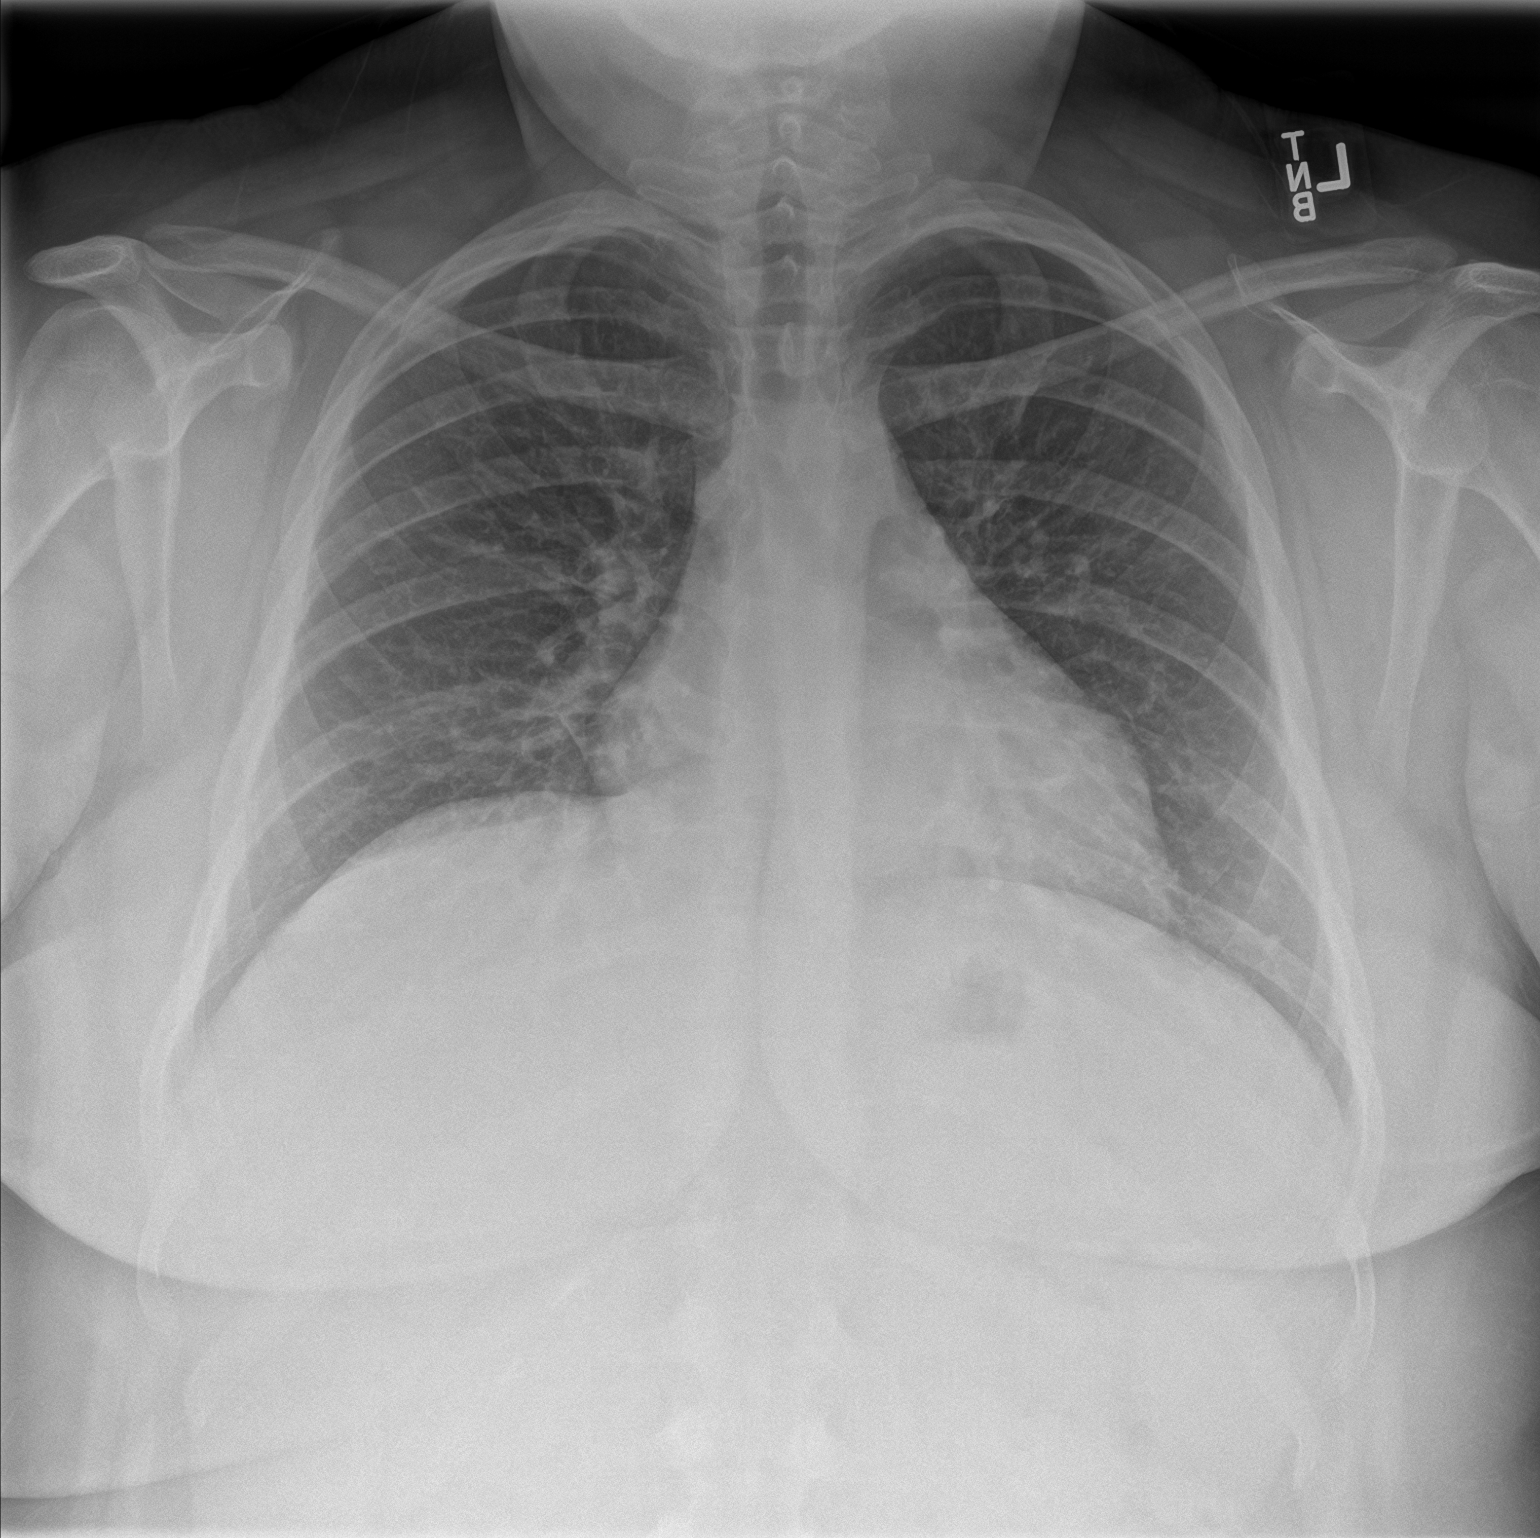

[chest lat]
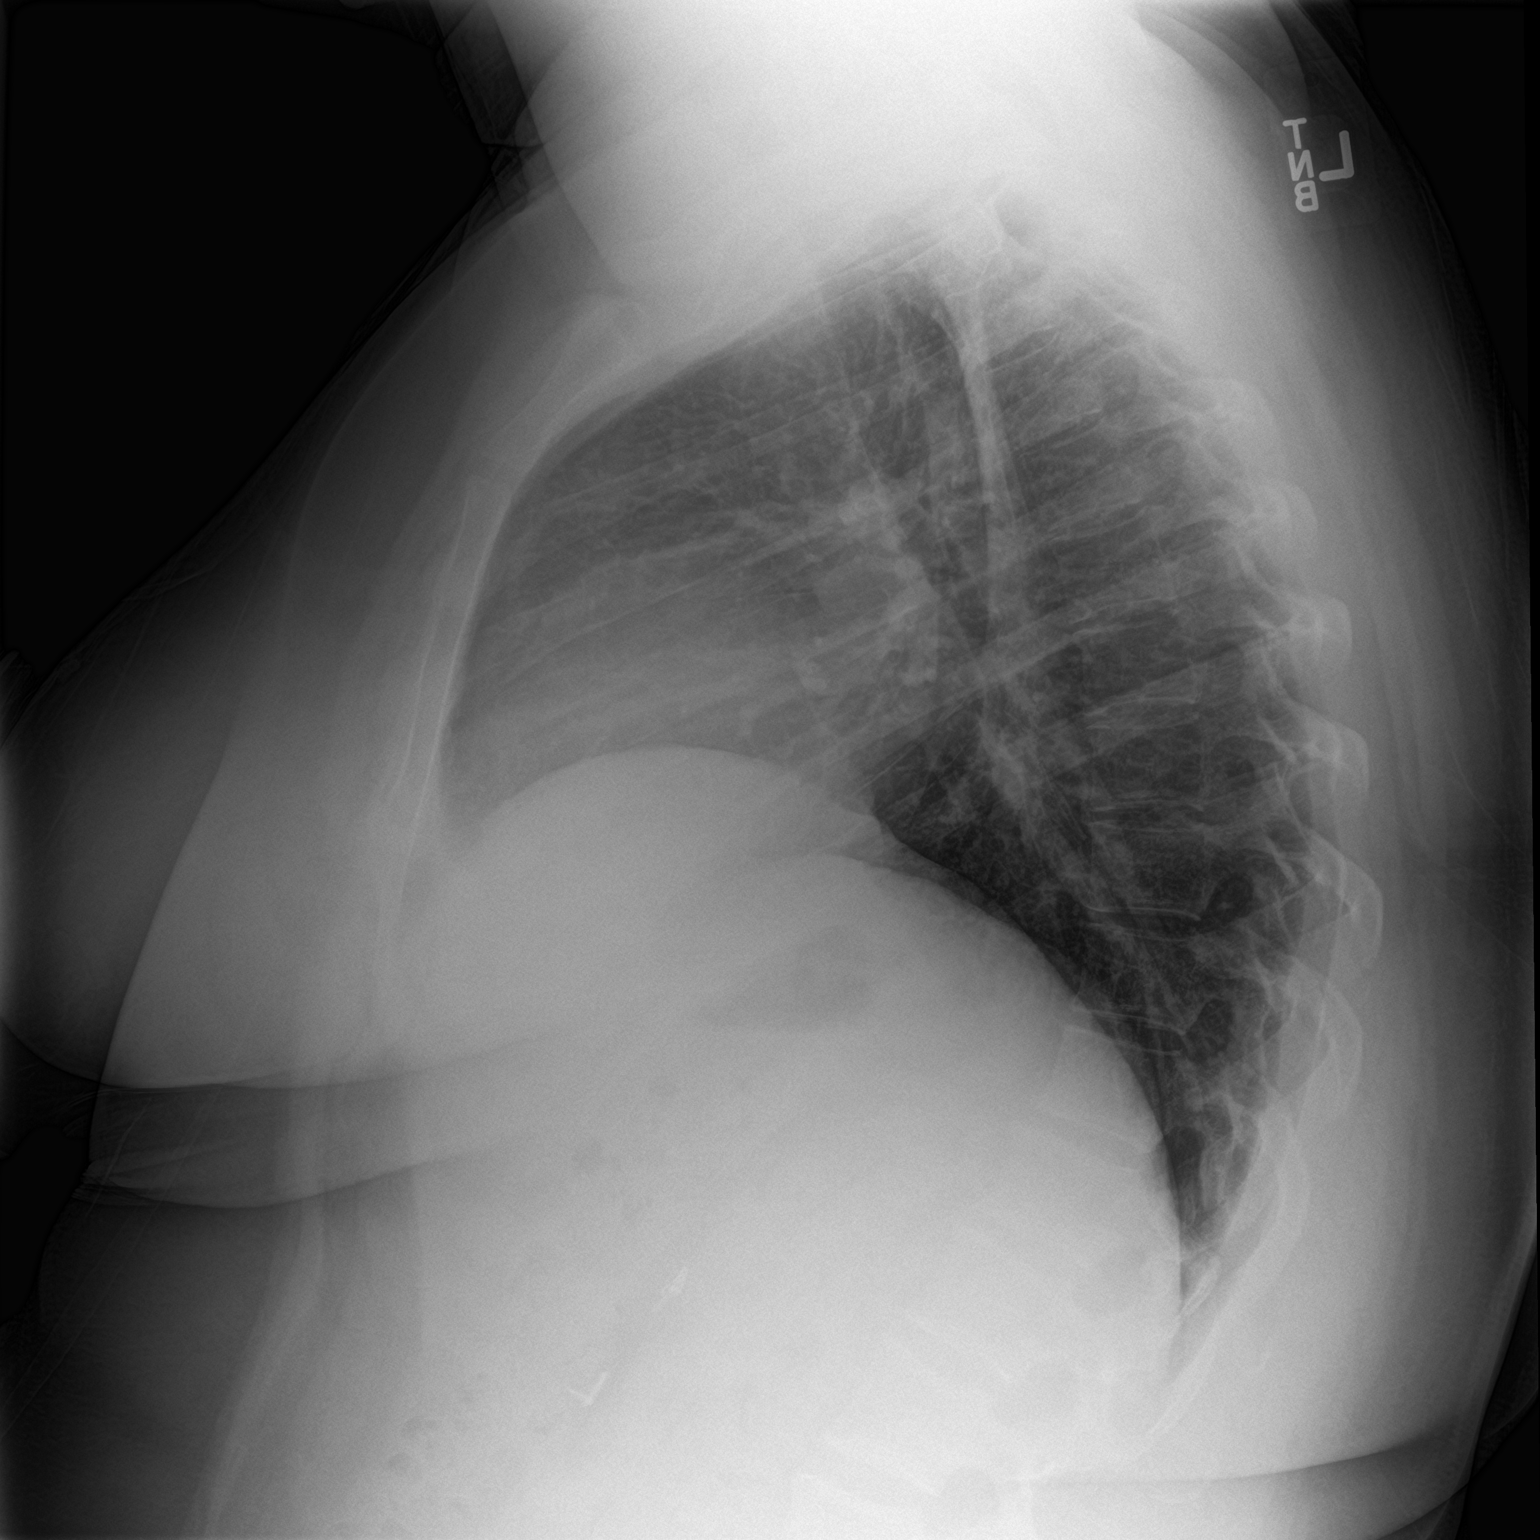

[2 of 2 positions shown; findings below may reference images not displayed]

FINDINGS: Stable asymmetric elevation right hemidiaphragm. The lungs are clear
without focal pneumonia, edema, pneumothorax or pleural effusion.
The cardiopericardial silhouette is within normal limits for size.
The visualized bony structures of the thorax are intact.
IMPRESSION: No active cardiopulmonary disease.

## 2018-04-16 ENCOUNTER — Emergency Department (HOSPITAL_COMMUNITY)
Admission: EM | Admit: 2018-04-16 | Discharge: 2018-04-16 | Disposition: A | Discharge status: Home / Self Care | Payer: Self-pay | Attending: Emergency Medicine | Admitting: Emergency Medicine

## 2018-04-16 ENCOUNTER — Encounter (HOSPITAL_COMMUNITY): Payer: Self-pay | Admitting: *Deleted

## 2018-04-16 ENCOUNTER — Other Ambulatory Visit: Payer: Self-pay

## 2018-04-16 DIAGNOSIS — G43809 Other migraine, not intractable, without status migrainosus: Secondary | ICD-10-CM | POA: Insufficient documentation

## 2018-04-16 DIAGNOSIS — I10 Essential (primary) hypertension: Secondary | ICD-10-CM | POA: Insufficient documentation

## 2018-04-16 DIAGNOSIS — R102 Pelvic and perineal pain: Secondary | ICD-10-CM | POA: Insufficient documentation

## 2018-04-16 LAB — CBC
HCT: 40.8 % (ref 36.0–46.0)
HEMOGLOBIN: 13.4 g/dL (ref 12.0–15.0)
MCH: 28.6 pg (ref 26.0–34.0)
MCHC: 32.8 g/dL (ref 30.0–36.0)
MCV: 87 fL (ref 80.0–100.0)
Platelets: 322 10*3/uL (ref 150–400)
RBC: 4.69 MIL/uL (ref 3.87–5.11)
RDW: 12 % (ref 11.5–15.5)
WBC: 8.7 10*3/uL (ref 4.0–10.5)
nRBC: 0 % (ref 0.0–0.2)

## 2018-04-16 LAB — BASIC METABOLIC PANEL
Anion gap: 6 (ref 5–15)
BUN: 15 mg/dL (ref 6–20)
CHLORIDE: 106 mmol/L (ref 98–111)
CO2: 27 mmol/L (ref 22–32)
CREATININE: 0.87 mg/dL (ref 0.44–1.00)
Calcium: 9.3 mg/dL (ref 8.9–10.3)
GFR calc non Af Amer: >60 mL/min (ref >60)
GLUCOSE: 95 mg/dL (ref 70–99)
Potassium: 3.7 mmol/L (ref 3.5–5.1)
Sodium: 139 mmol/L (ref 135–145)

## 2018-04-16 LAB — I-STAT BETA HCG BLOOD, ED (MC, WL, AP ONLY): I-stat hCG, quantitative: <5 m[IU]/mL (ref <5)

## 2018-04-16 MED ORDER — ONDANSETRON HCL 4 MG/2ML IJ SOLN
4.0000 mg | Freq: Once | INTRAMUSCULAR | Status: AC
  Administered 2018-04-16: 4 mg via INTRAVENOUS
  Filled 2018-04-16: qty 2

## 2018-04-16 MED ORDER — SODIUM CHLORIDE 0.9 % IV BOLUS
1000.0000 mL | Freq: Once | INTRAVENOUS | Status: AC
  Administered 2018-04-16: 1000 mL via INTRAVENOUS

## 2018-04-16 MED ORDER — DIPHENHYDRAMINE HCL 50 MG/ML IJ SOLN
25.0000 mg | Freq: Once | INTRAMUSCULAR | Status: AC
  Administered 2018-04-16: 25 mg via INTRAVENOUS
  Filled 2018-04-16: qty 1

## 2018-04-16 MED ORDER — PROCHLORPERAZINE EDISYLATE 10 MG/2ML IJ SOLN
10.0000 mg | Freq: Once | INTRAMUSCULAR | Status: AC
  Administered 2018-04-16: 10 mg via INTRAVENOUS
  Filled 2018-04-16: qty 2

## 2018-04-16 NOTE — ED Triage Notes (Addendum)
Pt c/o headache with nausea, blurred vision in left eye that started around 6pm, pt states that she has had a headache like this several years ago

## 2018-04-16 NOTE — ED Provider Notes (Addendum)
[facility] Emergency Department Provider Note MRN:  [id number]  Arrival date & time: 04/16/18     Chief Complaint   Headache   History of Present Illness   Jennifer Briggs is a 28 y.o. year-old female with a history of hypertension presenting to the ED with chief complaint of headache.  Patient explains that she gets frequent headaches, but is never been formally diagnosed with migraines and does not take any preventative headache medications.  Today at about 6 PM, when she got home from work, she began having a gradual onset diffuse headache, slowly worsening.  What was different today is that the vision in her left eye was found to be abnormal.  She describes it as looking through a kaleidoscope or looking through a broken glass window.  Denies numbness or weakness to the arms or legs.  Explains that she had a similar headache with similar vision disturbance several years ago.  Denies recent fever, no IV drug use, no neck pain, no chest pain or shortness of breath, no abdominal pain.  Patient also explains that last month she missed her period and she began bleeding today, but it is much heavier than her normal period.  Estimates to have soaked at least 6 pads today.  Experiencing mild pelvic pain consistent with prior menstrual cycles.  Bleeding was heavier this morning, seems to be improving.  Review of Systems  A complete 10 system review of systems was obtained and all systems are negative except as noted in the HPI and PMH.   Patient's Health History    Past Medical History:  Diagnosis Date  . Anxiety   . Complication of anesthesia    difficuly waking up from anesthesia from dental surgery  . Family history of adverse reaction to anesthesia    mother had difficulty waking up from anesthesia  . Hypertension   . Migraines     Past Surgical History:  Procedure Laterality Date  . ADENOIDECTOMY    . CHOLECYSTECTOMY N/A 12/08/2014   Procedure: LAPAROSCOPIC  CHOLECYSTECTOMY;  Surgeon: Franky MachoMark Jenkins, MD;  Location: [facility];  Service: General;  Laterality: N/A;  . TONSILLECTOMY    . TOOTH EXTRACTION Left 2013    No family history on file.  Social History   Socioeconomic History  . Marital status: Married    Spouse name: Not on file  . Number of children: Not on file  . Years of education: Not on file  . Highest education level: Not on file  Occupational History  . Not on file  Social Needs  . Financial resource strain: Not on file  . Food insecurity:    Worry: Not on file    Inability: Not on file  . Transportation needs:    Medical: Not on file    Non-medical: Not on file  Tobacco Use  . Smoking status: Never Smoker  . Smokeless tobacco: Never Used  Substance and Sexual Activity  . Alcohol use: No  . Drug use: No  . Sexual activity: Yes    Birth control/protection: Other-see comments  Lifestyle  . Physical activity:    Days per week: Not on file    Minutes per session: Not on file  . Stress: Not on file  Relationships  . Social connections:    Talks on phone: Not on file    Gets together: Not on file    Attends religious service: Not on file    Active member of club or organization: Not on  file    Attends meetings of clubs or organizations: Not on file    Relationship status: Not on file  . Intimate partner violence:    Fear of current or ex partner: Not on file    Emotionally abused: Not on file    Physically abused: Not on file    Forced sexual activity: Not on file  Other Topics Concern  . Not on file  Social History Narrative  . Not on file     Physical Exam  Vital Signs and Nursing Notes reviewed Vitals:   04/16/18 2032 04/16/18 2313  BP: 123/79 129/66  Pulse: 63 75  Resp: 18 16  Temp: 97.8 F (36.6 C)   SpO2: 99% 99%    CONSTITUTIONAL: Well-appearing, NAD NEURO:  Alert and oriented x 3, no focal deficits, normal and symmetric strength and sensation, normal extraocular movements, no nystagmus, normal  gait, Romberg negative EYES:  eyes equal and reactive, no visual field cuts, normal visual acuity ENT/NECK:  no LAD, no JVD CARDIO: Regular rate, well-perfused, normal S1 and S2 PULM:  CTAB no wheezing or rhonchi GI/GU:  normal bowel sounds, non-distended, non-tender MSK/SPINE:  No gross deformities, no edema SKIN:  no rash, atraumatic PSYCH:  Appropriate speech and behavior  Diagnostic and Interventional Summary    Labs Reviewed  CBC  BASIC METABOLIC PANEL  I-STAT BETA HCG BLOOD, ED ([facility], [facility], [facility] ONLY)    No orders to display    Medications  sodium chloride 0.9 % bolus 1,000 mL (0 mLs Intravenous Stopped 04/16/18 2310)  prochlorperazine (COMPAZINE) injection 10 mg (10 mg Intravenous Given 04/16/18 2219)  diphenhydrAMINE (BENADRYL) injection 25 mg (25 mg Intravenous Given 04/16/18 2218)  ondansetron (ZOFRAN) injection 4 mg (4 mg Intravenous Given 04/16/18 2221)     Procedures Critical Care  ED Course and Medical Decision Making  I have reviewed the triage vital signs and the nursing notes.  Pertinent labs & imaging results that were available during my care of the patient were reviewed by me and considered in my medical decision making (see below for details).  Favoring complex migraine with scintillating scotoma in this 28 year old female with reported history of the same.  No objective focal neurological deficits will reassess after migraine cocktail.  Patient's heavy vaginal bleeding today likely related to her missing her period last month, will obtain screening CBC and hCG to ensure no dangerous loss of blood, to ensure hCG negative status given the possibility of ectopic pregnancy.  Clinical Course as of Apr 16 2314  Mon Apr 16, 2018  2230 Given the complaint of heavy vaginal bleeding today, pelvic exam was discussed and offered to the patient.  Patient elects to first evaluate her blood counts to determine need for pelvic exam.   [MB]    Clinical Course User Index [MB]  Sabas SousBero, Maylynn Orzechowski M, MD    hCG negative, labs reassuring, patient symptoms are completely resolved after migraine cocktail.  Consistent with migraine, advised PCP follow-up.  Return precautions for worsening heavy vaginal bleeding.  Pelvic exam again offered but again deferred by patient, who will follow-up with her GYN.  After the discussed management above, the patient was determined to be safe for discharge.  The patient was in agreement with this plan and all questions regarding their care were answered.  ED return precautions were discussed and the patient will return to the ED with any significant worsening of condition.  Elmer SowMichael M. Pilar PlateBero, MD [facility] [email or web address]  Final Clinical Impressions(s) / ED Diagnoses     ICD-10-CM   1. Other migraine without status migrainosus, not intractable G43.809     ED Discharge Orders    None         Sabas Sous, MD 04/16/18 2317    Sabas Sous, MD 04/16/18 [phone]

## 2018-04-16 NOTE — ED Notes (Signed)
Pt states that she is feeling better, denies any pain, update given,

## 2018-04-16 NOTE — Discharge Instructions (Addendum)
You were evaluated in the Emergency Department and after careful evaluation, we did not find any emergent condition requiring admission or further testing in the hospital.  Your symptoms today seem to be due to a migraine.  Please follow-up with a primary care provider to discuss preventative medications.  Please return to the Emergency Department if you experience any worsening of your condition.  We encourage you to follow up with a primary care provider.  Thank you for allowing [place] to be a part of your care.

## 2019-07-09 ENCOUNTER — Other Ambulatory Visit: Payer: Self-pay

## 2019-07-09 ENCOUNTER — Ambulatory Visit: Payer: Self-pay | Attending: Internal Medicine

## 2019-07-09 DIAGNOSIS — U071 COVID-19: Secondary | ICD-10-CM | POA: Insufficient documentation

## 2019-07-09 DIAGNOSIS — Z20822 Contact with and (suspected) exposure to covid-19: Secondary | ICD-10-CM

## 2019-07-10 LAB — SARS-COV-2, NAA 2 DAY TAT

## 2019-07-10 LAB — NOVEL CORONAVIRUS, NAA: SARS-CoV-2, NAA: DETECTED — AB

## 2019-07-11 ENCOUNTER — Telehealth: Payer: Self-pay | Admitting: Unknown Physician Specialty

## 2019-07-11 NOTE — Telephone Encounter (Signed)
Called to discuss with Jennifer Briggs about Covid symptoms and the use of bamlanivimab, a monoclonal antibody infusion for those with mild to moderate Covid symptoms and at a high risk of hospitalization.     Pt is qualified for this infusion at the [facility] infusion center due to co-morbid conditions and/or a member of an at-risk group, however declines infusion at this time. Symptoms tier reviewed as well as criteria for ending isolation.  Symptoms reviewed that would warrant ED/Hospital evaluation. Preventative practices reviewed. Patient verbalized understanding. Patient advised to call back if he decides that he does want to get infusion. Callback number to the infusion center given. Patient advised to go to Urgent care or ED with severe symptoms. Last date she would be eligible for infusion is 07/13/2019.

## 2020-07-07 ENCOUNTER — Ambulatory Visit
Admission: EM | Admit: 2020-07-07 | Discharge: 2020-07-07 | Disposition: A | Discharge status: Home / Self Care | Payer: Self-pay | Attending: Family Medicine | Admitting: Family Medicine

## 2020-07-07 ENCOUNTER — Other Ambulatory Visit: Payer: Self-pay

## 2020-07-07 ENCOUNTER — Encounter: Payer: Self-pay | Admitting: Emergency Medicine

## 2020-07-07 DIAGNOSIS — L089 Local infection of the skin and subcutaneous tissue, unspecified: Secondary | ICD-10-CM

## 2020-07-07 DIAGNOSIS — B9689 Other specified bacterial agents as the cause of diseases classified elsewhere: Secondary | ICD-10-CM

## 2020-07-07 MED ORDER — HYDROCODONE-ACETAMINOPHEN 5-325 MG PO TABS: 1.0000 | ORAL_TABLET | Freq: Four times a day (QID) | ORAL | 0 refills | Status: AC | PRN

## 2020-07-07 MED ORDER — DOXYCYCLINE HYCLATE 100 MG PO CAPS: 100.0000 mg | ORAL_CAPSULE | Freq: Two times a day (BID) | ORAL | 0 refills | Status: AC

## 2020-07-07 NOTE — ED Triage Notes (Signed)
knot under LT breast for over a year.  This weekend the area got bigger and began to drain.

## 2020-07-07 NOTE — Discharge Instructions (Addendum)

## 2020-07-08 NOTE — ED Provider Notes (Signed)
[facility]   [id number] 07/07/20 Arrival Time: 1735  ASSESSMENT & PLAN:  1. Localized bacterial skin infection    Declines I&D attempt at this time. Prefers trial of antibiotic first. Has new PCP f/u in 2-3 days.  Meds ordered this encounter  Medications  . doxycycline (VIBRAMYCIN) 100 MG capsule    Sig: Take 1 capsule (100 mg total) by mouth 2 (two) times daily.    Dispense:  14 capsule    Refill:  0  . HYDROcodone-acetaminophen (NORCO/VICODIN) 5-325 MG tablet    Sig: Take 1 tablet by mouth every 6 (six) hours as needed for moderate pain or severe pain.    Dispense:  10 tablet    Refill:  0    Follow-up Information    [facility].   Specialty: Urgent Care Why: If worsening or failing to improve as anticipated. Contact information: [address] [place] [address] [phone]             Finish all antibiotics. OTC analgesics as needed.  [place] Controlled Substances Registry consulted for this patient. I feel the risk/benefit ratio today is favorable for proceeding with this prescription for a controlled substance. Medication sedation precautions given.  Reviewed expectations re: course of current medical issues. Questions answered. Outlined signs and symptoms indicating need for more acute intervention. Patient verbalized understanding. After Visit Summary given.   SUBJECTIVE:  Jennifer Briggs is a 30 y.o. female who presents with a possible infection of her skin under L breast; 'bump has been there awhile'; recent increased pain and erythema. Trouble sleeping secondary to pain. Reports slight serous drainage at times over past two days. Symptoms have been basically asymptomatic since beginning. Fever: absent. OTC/home treatment: none PTA.   OBJECTIVE:  Vitals:   07/07/20 1751  BP: 121/81  Pulse: 91  Resp: 19  Temp: 98.6 F (37 C)  TempSrc: Oral  SpO2: 98%    General appearance: alert;  no distress Chest: approx 1x2 cm area of thickened erythematous skin under L breast without fluctuance; no active drainage or bleeding; very TTP (chaperone present) Psychological: alert and cooperative; normal mood and affect  Allergies  Allergen Reactions  . Morphine And Related     Neck stiffness     Past Medical History:  Diagnosis Date  . Anxiety   . Complication of anesthesia    difficuly waking up from anesthesia from dental surgery  . Family history of adverse reaction to anesthesia    mother had difficulty waking up from anesthesia  . Hypertension   . Migraines    Social History   Socioeconomic History  . Marital status: Married    Spouse name: Not on file  . Number of children: Not on file  . Years of education: Not on file  . Highest education level: Not on file  Occupational History  . Not on file  Tobacco Use  . Smoking status: Never Smoker  . Smokeless tobacco: Never Used  Vaping Use  . Vaping Use: Never used  Substance and Sexual Activity  . Alcohol use: No  . Drug use: No  . Sexual activity: Yes    Birth control/protection: Other-see comments    Comment: husband has vastectomy   Other Topics Concern  . Not on file  Social History Narrative  . Not on file   Social Determinants of Health   Financial Resource Strain: Not on file  Food Insecurity: Not on file  Transportation Needs:  Not on file  Physical Activity: Not on file  Stress: Not on file  Social Connections: Not on file   No family history on file. Past Surgical History:  Procedure Laterality Date  . ADENOIDECTOMY    . CHOLECYSTECTOMY N/A 12/08/2014   Procedure: LAPAROSCOPIC CHOLECYSTECTOMY;  Surgeon: Franky Macho, MD;  Location: [facility];  Service: General;  Laterality: N/A;  . TONSILLECTOMY    . TOOTH EXTRACTION Left 2013           Mardella Layman, MD 07/08/20 [phone]

## 2020-10-26 ENCOUNTER — Ambulatory Visit: Payer: Self-pay | Admitting: Nutrition

## 2020-11-04 ENCOUNTER — Encounter: Payer: Self-pay | Admitting: Nutrition

## 2020-11-04 ENCOUNTER — Encounter: Payer: Self-pay | Attending: Physician Assistant | Admitting: Nutrition

## 2020-11-04 ENCOUNTER — Other Ambulatory Visit: Payer: Self-pay

## 2020-11-04 VITALS — Ht 66.0 in | Wt 271.0 lb

## 2020-11-04 DIAGNOSIS — F3289 Other specified depressive episodes: Secondary | ICD-10-CM | POA: Insufficient documentation

## 2020-11-04 DIAGNOSIS — E669 Obesity, unspecified: Secondary | ICD-10-CM | POA: Insufficient documentation

## 2020-11-04 NOTE — Patient Instructions (Addendum)
Goals  Follow My Plate Eat meals on time Don't eat after 7 pm. Drink 100 oz of meals Cut out processed foods Increase fresh fruits and vegetables Lose 1 lb per week.

## 2020-11-04 NOTE — Progress Notes (Signed)
Medical Nutrition Therapy 0830 and 930  Primary concerns today: Obesity  Referral diagnosis: E66.9 Preferred learning style no preference  Learning readiness: Ready    NUTRITION ASSESSMENT  Her current obesity is related to excess calories coming from a lot of processed foods and eating irregularly. She is motivated and wants to lose weight to be healthier for herself and her family.  Anthropometrics  Wt Readings from Last 3 Encounters:  11/04/20 271 lb (122.9 kg)  04/16/18 260 lb (117.9 kg)  06/27/17 260 lb (117.9 kg)   Ht Readings from Last 3 Encounters:  11/04/20 5\' 6"  (1.676 m)  04/16/18 5\' 6"  (1.676 m)  06/27/17 5\' 6"  (1.676 m)   Body mass index is 43.74 kg/m. @BMIFA @ Facility age limit for growth percentiles is 20 years. Facility age limit for growth percentiles is 20 years.    Clinical Medical Hx: Asthma, depression Medications: see chart Labs:  Notable Signs/Symptoms: none  Lifestyle & Dietary Hx LIves with husband and 3 kids. She does the cooking and shopping. Working . 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.,   Estimated daily fluid intake: 48  oz Supplements: none Sleep: 7  Stress / self-care: family issues, working and going to school Current average weekly physical activity: ADL  24-Hr Dietary Recall First Meal: sometimes eats eggs,  Yesterday- apple cinnamon roll Snack: chips,  french onion dip Second Meal: Salad ham, cheese, radishes, cukes, pineapple, tomatoes, gold fish, crutons, ranch/french dressing, coke zero Snack:  Third Meal: Curly fries and roast beef sandwich, cheese sauce, Coke zero Snack: coke zero Beverages: Diet sodas, some water, diet green tea  Estimated Energy Needs Calories: 1200  Carbohydrate: 135g Protein: 90g Fat: 33g   NUTRITION DIAGNOSIS  NI-1.5 Excessive energy intake As related to high fat high processed diet.  As evidenced by food journal and BMI > 30.   NUTRITION INTERVENTION  Nutrition education (E-1) on  the following topics:  Nutrition and weight loss education provided on My Plate, CHO counting, meal planning, portion sizes, timing of meals, nutrient density of foods, hydration, benefits of exercising 30 minutes per day and prevention of complications DM.   Handouts Provided Include  My Plate Meal Plan Card   Learning Style & Readiness for Change Teaching method utilized: Visual & Auditory  Demonstrated degree of understanding via: Teach Back  Barriers to learning/adherence to lifestyle change: none  Goals Established by Pt Goals  Follow My Plate Eat meals on time Don't eat after 7 pm. Drink 100 oz of meals Cut out processed foods Increase fresh fruits and vegetables Lose 1 lb per week.   MONITORING & EVALUATION Dietary intake, weekly physical activity, and weight in 1 month.  Next Steps  Patient is to work on meal planning.

## 2020-12-09 ENCOUNTER — Ambulatory Visit: Payer: Self-pay | Admitting: Nutrition

## 2020-12-29 ENCOUNTER — Other Ambulatory Visit: Payer: Self-pay

## 2020-12-29 MED ORDER — FLUOXETINE HCL 10 MG PO CAPS
ORAL_CAPSULE | ORAL | 1 refills | Status: DC
  Filled 2020-12-29: qty 30, 30d supply, fill #0
  Filled 2021-02-02: qty 30, 30d supply, fill #1

## 2020-12-29 MED ORDER — BUPROPION HCL ER (SR) 150 MG PO TB12
ORAL_TABLET | ORAL | 1 refills | Status: AC
  Filled 2020-12-29: qty 30, 30d supply, fill #0
  Filled 2021-02-02: qty 30, 30d supply, fill #1

## 2020-12-29 MED ORDER — FLUOXETINE HCL 20 MG PO CAPS
ORAL_CAPSULE | ORAL | 1 refills | Status: DC
  Filled 2020-12-29: qty 30, 30d supply, fill #0
  Filled 2021-02-02: qty 30, 30d supply, fill #1

## 2020-12-30 ENCOUNTER — Other Ambulatory Visit: Payer: Self-pay

## 2020-12-31 ENCOUNTER — Other Ambulatory Visit: Payer: Self-pay

## 2021-02-02 ENCOUNTER — Other Ambulatory Visit: Payer: Self-pay

## 2021-02-04 ENCOUNTER — Other Ambulatory Visit: Payer: Self-pay

## 2021-02-10 ENCOUNTER — Other Ambulatory Visit: Payer: Self-pay

## 2021-02-10 MED ORDER — FLUOXETINE HCL 20 MG PO CAPS: ORAL_CAPSULE | ORAL | 1 refills | Status: AC

## 2021-02-10 MED ORDER — FLUOXETINE HCL 10 MG PO CAPS: ORAL_CAPSULE | ORAL | 1 refills | Status: AC
